# Patient Record
Sex: Male | Born: 1970
Health system: Southern US, Community
[De-identification: ages and names within clinical notes are randomized; demographics above are authoritative.]

## PROBLEM LIST (undated history)

## (undated) DIAGNOSIS — F32A Depression, unspecified: Secondary | ICD-10-CM

## (undated) DIAGNOSIS — R7989 Other specified abnormal findings of blood chemistry: Secondary | ICD-10-CM

## (undated) DIAGNOSIS — G4700 Insomnia, unspecified: Secondary | ICD-10-CM

## (undated) DIAGNOSIS — N529 Male erectile dysfunction, unspecified: Secondary | ICD-10-CM

## (undated) DIAGNOSIS — I10 Essential (primary) hypertension: Secondary | ICD-10-CM

## (undated) DIAGNOSIS — G44009 Cluster headache syndrome, unspecified, not intractable: Secondary | ICD-10-CM

## (undated) DIAGNOSIS — K219 Gastro-esophageal reflux disease without esophagitis: Secondary | ICD-10-CM

## (undated) DIAGNOSIS — F329 Major depressive disorder, single episode, unspecified: Secondary | ICD-10-CM

## (undated) DIAGNOSIS — G4730 Sleep apnea, unspecified: Secondary | ICD-10-CM

## (undated) DIAGNOSIS — J339 Nasal polyp, unspecified: Secondary | ICD-10-CM

## (undated) DIAGNOSIS — K56609 Unspecified intestinal obstruction, unspecified as to partial versus complete obstruction: Secondary | ICD-10-CM

## (undated) HISTORY — DX: Essential (primary) hypertension: I10

## (undated) HISTORY — DX: Unspecified intestinal obstruction, unspecified as to partial versus complete obstruction: K56.609

## (undated) HISTORY — DX: Sleep apnea, unspecified: G47.30

## (undated) HISTORY — DX: Male erectile dysfunction, unspecified: N52.9

## (undated) HISTORY — DX: Other specified abnormal findings of blood chemistry: R79.89

## (undated) HISTORY — DX: Insomnia, unspecified: G47.00

## (undated) HISTORY — PX: HEMORRHOID SURGERY: SHX153

## (undated) HISTORY — DX: Gastro-esophageal reflux disease without esophagitis: K21.9

## (undated) HISTORY — DX: Nasal polyp, unspecified: J33.9

## (undated) HISTORY — DX: Depression, unspecified: F32.A

## (undated) HISTORY — DX: Major depressive disorder, single episode, unspecified: F32.9

## (undated) HISTORY — DX: Cluster headache syndrome, unspecified, not intractable: G44.009

---

## 1997-08-27 ENCOUNTER — Ambulatory Visit (HOSPITAL_COMMUNITY): Admission: RE | Admit: 1997-08-27 | Discharge: 1997-08-27 | Payer: Self-pay | Admitting: Neurology

## 2005-03-05 ENCOUNTER — Ambulatory Visit (HOSPITAL_BASED_OUTPATIENT_CLINIC_OR_DEPARTMENT_OTHER): Admission: RE | Admit: 2005-03-05 | Discharge: 2005-03-05 | Payer: Self-pay | Admitting: Otolaryngology

## 2012-04-13 DIAGNOSIS — Z01810 Encounter for preprocedural cardiovascular examination: Secondary | ICD-10-CM

## 2016-06-19 ENCOUNTER — Encounter: Payer: Self-pay | Admitting: "Endocrinology

## 2016-08-04 LAB — PULMONARY FUNCTION TEST

## 2016-09-20 ENCOUNTER — Encounter: Payer: Self-pay | Admitting: "Endocrinology

## 2016-10-14 ENCOUNTER — Encounter: Payer: Self-pay | Admitting: *Deleted

## 2016-10-15 ENCOUNTER — Telehealth: Payer: Self-pay | Admitting: Cardiovascular Disease

## 2016-10-15 ENCOUNTER — Encounter (INDEPENDENT_AMBULATORY_CARE_PROVIDER_SITE_OTHER): Payer: Self-pay

## 2016-10-15 ENCOUNTER — Other Ambulatory Visit: Payer: Self-pay | Admitting: Cardiovascular Disease

## 2016-10-15 ENCOUNTER — Encounter: Payer: Self-pay | Admitting: *Deleted

## 2016-10-15 ENCOUNTER — Encounter: Payer: Self-pay | Admitting: Cardiovascular Disease

## 2016-10-15 ENCOUNTER — Ambulatory Visit (INDEPENDENT_AMBULATORY_CARE_PROVIDER_SITE_OTHER): Payer: 59 | Admitting: Cardiovascular Disease

## 2016-10-15 VITALS — BP 130/85 | HR 90 | Ht 71.0 in | Wt 263.8 lb

## 2016-10-15 DIAGNOSIS — R0609 Other forms of dyspnea: Secondary | ICD-10-CM | POA: Diagnosis not present

## 2016-10-15 DIAGNOSIS — R079 Chest pain, unspecified: Secondary | ICD-10-CM | POA: Diagnosis not present

## 2016-10-15 DIAGNOSIS — Z716 Tobacco abuse counseling: Secondary | ICD-10-CM

## 2016-10-15 DIAGNOSIS — R5383 Other fatigue: Secondary | ICD-10-CM

## 2016-10-15 DIAGNOSIS — G4733 Obstructive sleep apnea (adult) (pediatric): Secondary | ICD-10-CM | POA: Diagnosis not present

## 2016-10-15 DIAGNOSIS — I10 Essential (primary) hypertension: Secondary | ICD-10-CM | POA: Insufficient documentation

## 2016-10-15 MED ORDER — METOPROLOL TARTRATE 50 MG PO TABS: 50.0000 mg | ORAL_TABLET | ORAL | 0 refills | Status: DC

## 2016-10-15 NOTE — Progress Notes (Signed)
CARDIOLOGY CONSULT NOTE  Patient ID: Devin Ray MRN: [id number] DOB/AGE: 1970/12/18 46 y.o.  Admit date: (Not on file) Primary Physician: Glenda Chroman, MD Referring Physician: Dr. Woody Seller  Reason for Consultation: Exertional dyspnea  HPI: Devin M Dibiasio is a 46 y.o. male who is being seen today for the evaluation of exertional dyspnea at the request of Vyas, Dhruv B, MD.   Echocardiogram performed at an outside facility on 07/26/16 demonstrated normal left ventricular systolic function, LVEF 25-42%, diastolic dysfunction, mild LVH, and no significant valvular abnormalities.  Pulmonary function testing reportedly demonstrated minimal obstructive lung disease.  ECG performed in the office today which I ordered and personally interpreted demonstrated sinus rhythm with a right bundle branch block.  He said that he had an episode of shortness of breath which occurred 4 years ago and he has not felt the same ever since. He was diagnosed with sleep apnea in 2014 and said he uses CPAP religiously. However, it has made a minimal improvement in his symptoms. He is scheduled to see a sleep specialist in [place] in the near future.  He said he has gained a considerable amount of weight and also has low testosterone. He feels mildly depressed.  He works in Scientist, research (life sciences) and receiving. He notices that he breathes more heavily and is also more irritable.  He says no matter how much food he eats, he is unable to satisfy his appetite. He said "things are moving in slow motion at all times ".  He [facility] the [facility] about 2 weeks ago and had some chest pain. He also describes "heart fluttering ".  He smokes 1.5 packs of cigarettes daily and has done so for 30 years.    Not on File  Current Outpatient Prescriptions  Medication Sig Dispense Refill  . buPROPion (WELLBUTRIN XL) 150 MG 24 hr tablet Take 150 mg by mouth daily.    Marland Kitchen testosterone cypionate (DEPOTESTOTERONE CYPIONATE) 100 MG/ML  injection Inject 200 mg into the muscle every 28 (twenty-eight) days. For IM use only     No current facility-administered medications for this visit.     Past Medical History:  Diagnosis Date  . Cluster headache   . Depression   . ED (erectile dysfunction)   . GERD (gastroesophageal reflux disease)   . Hypertension   . Insomnia   . Low testosterone   . Nasal polyp   . Sleep apnea     No past surgical history on file.  Social History   Social History  . Marital status: Married    Spouse name: N/A  . Number of children: N/A  . Years of education: N/A   Occupational History  . Not on file.   Social History Main Topics  . Smoking status: Current Every Day Smoker    Packs/day: 1.50    Types: Cigarettes    Start date: 10/17/1984  . Smokeless tobacco: Former Systems developer    Types: Snuff  . Alcohol use Not on file  . Drug use: Unknown  . Sexual activity: Not on file   Other Topics Concern  . Not on file   Social History Narrative  . No narrative on file     No family history of premature CAD in 1st degree relatives.  Current Meds  Medication Sig  . buPROPion (WELLBUTRIN XL) 150 MG 24 hr tablet Take 150 mg by mouth daily.  Marland Kitchen testosterone cypionate (DEPOTESTOTERONE CYPIONATE) 100 MG/ML injection Inject 200 mg into the  muscle every 28 (twenty-eight) days. For IM use only      Review of systems complete and found to be negative unless listed above in HPI    Physical exam Blood pressure 130/85, pulse 90, height 5\' 11"  (1.803 m), weight 263 lb 12.8 oz (119.7 kg), SpO2 96 %. General: NAD Neck: No JVD, no thyromegaly or thyroid nodule.  Lungs: Clear to auscultation bilaterally with normal respiratory effort. CV: Nondisplaced PMI. Regular rate and rhythm, normal S1/S2, no S3/S4, no murmur.  No peripheral edema.  No carotid bruit.    Abdomen: Soft, nontender, protuberant. Skin: Intact without lesions or rashes.  Neurologic: Alert and oriented x 3.  Psych: Normal  affect. Extremities: No clubbing or cyanosis.  HEENT: Normal.   ECG: Most recent ECG reviewed.   Labs: No results found for: K, BUN, CREATININE, ALT, TSH, HGB   Lipids: No results found for: LDLCALC, LDLDIRECT, CHOL, TRIG, HDL      ASSESSMENT AND PLAN:  1. Exertional dyspnea, chest pain, and fatigue: He has both typical and atypical symptoms for ischemic heart disease. Risk factors include obesity and tobacco abuse. I will obtain a CT with fractional flow reserve to assess for coronary artery disease. Echocardiogram demonstrated normal left ventricular systolic function and regional wall motion.  2. Fatigue: He may need titration of CPAP settings. He is scheduled to see a sleep specialist in [place] in the near future. I recommended tobacco cessation. I will also evaluate for ischemic heart disease. He has low testosterone which could also be contributing to his symptomatology. I will obtain a copy of labs performed by PCP.  3. Obstructive sleep apnea: He may need titration of CPAP settings. He is scheduled to see a sleep specialist in [place] in the near future.  4. Tobacco abuse: I provided cessation counseling (3 minutes).     Disposition: Follow up in 6 weeks.   Signed: Kate Sable, M.D., F.A.C.C.  10/15/2016, 2:23 PM

## 2016-10-15 NOTE — Telephone Encounter (Signed)
CT w/FFR Larence Penning) dx: DOE Scheduled for Oct 29, 2016 arrive  At 14

## 2016-10-15 NOTE — Patient Instructions (Addendum)
Medication Instructions:   Your physician recommends that you continue on your current medications as directed. Please refer to the Current Medication list given to you today.  Labwork:  Your physician recommends that you return for lab work in: BMET.  Testing/Procedures: Your physician has requested that you have cardiac CT. Cardiac computed tomography (CT) is a painless test that uses an x-ray machine to take clear, detailed pictures of your heart. For further information please visit [facility]. Please follow instruction sheet as given.  Follow-Up:  Your physician recommends that you schedule a follow-up appointment in: 6 weeks.  Any Other Special Instructions Will Be Listed Below (If Applicable).   If you need a refill on your cardiac medications before your next appointment, please call your pharmacy.

## 2016-10-15 NOTE — Addendum Note (Signed)
Addended by: Merlene Laughter on: 10/15/2016 03:01 PM   Modules accepted: Orders

## 2016-10-18 NOTE — Telephone Encounter (Signed)
Schedule Nov 08, 2016 at 1:30 Dr Meda Coffee reading  Patient aware of change and knows to be there at 1:00.

## 2016-10-28 ENCOUNTER — Telehealth: Payer: Self-pay | Admitting: *Deleted

## 2016-10-28 NOTE — Telephone Encounter (Signed)
Notes recorded by Laurine Blazer, LPN on 12/27/4973 at 1:59 PM EDT Patient notified. Copy to pmd. ------  Notes recorded by Herminio Commons, MD on 10/27/2016 at 10:59 AM EDT Normal

## 2016-10-29 ENCOUNTER — Ambulatory Visit (HOSPITAL_COMMUNITY): Payer: 59

## 2016-11-08 ENCOUNTER — Ambulatory Visit (HOSPITAL_COMMUNITY): Payer: 59

## 2016-11-08 ENCOUNTER — Encounter (HOSPITAL_COMMUNITY): Payer: Self-pay

## 2016-11-08 ENCOUNTER — Ambulatory Visit (HOSPITAL_COMMUNITY)
Admission: RE | Admit: 2016-11-08 | Discharge: 2016-11-08 | Disposition: A | Discharge status: Home / Self Care | Payer: 59 | Source: Ambulatory Visit | Attending: Cardiovascular Disease | Admitting: Cardiovascular Disease

## 2016-11-08 DIAGNOSIS — I288 Other diseases of pulmonary vessels: Secondary | ICD-10-CM | POA: Diagnosis not present

## 2016-11-08 DIAGNOSIS — I2510 Atherosclerotic heart disease of native coronary artery without angina pectoris: Secondary | ICD-10-CM | POA: Insufficient documentation

## 2016-11-08 DIAGNOSIS — G4733 Obstructive sleep apnea (adult) (pediatric): Secondary | ICD-10-CM | POA: Diagnosis present

## 2016-11-08 DIAGNOSIS — R0609 Other forms of dyspnea: Secondary | ICD-10-CM | POA: Diagnosis not present

## 2016-11-08 MED ORDER — NITROGLYCERIN 0.4 MG SL SUBL
SUBLINGUAL_TABLET | SUBLINGUAL | Status: AC
  Filled 2016-11-08: qty 2

## 2016-11-08 MED ORDER — NITROGLYCERIN 0.4 MG SL SUBL
0.8000 mg | SUBLINGUAL_TABLET | Freq: Once | SUBLINGUAL | Status: AC
  Administered 2016-11-08: 0.8 mg via SUBLINGUAL

## 2016-11-08 MED ORDER — METOPROLOL TARTRATE 5 MG/5ML IV SOLN
INTRAVENOUS | Status: DC
Start: 2016-11-08 — End: 2016-11-09
  Filled 2016-11-08: qty 15

## 2016-11-08 MED ORDER — IOPAMIDOL (ISOVUE-370) INJECTION 76%
INTRAVENOUS | Status: AC
  Administered 2016-11-08: 80 mL
  Filled 2016-11-08: qty 100

## 2016-11-08 MED ORDER — METOPROLOL TARTRATE 5 MG/5ML IV SOLN
5.0000 mg | INTRAVENOUS | Status: DC | PRN
  Administered 2016-11-08 (×3): 5 mg via INTRAVENOUS

## 2016-11-17 ENCOUNTER — Ambulatory Visit: Payer: 59 | Admitting: Pulmonary Disease

## 2016-11-17 ENCOUNTER — Encounter: Payer: Self-pay | Admitting: Pulmonary Disease

## 2016-11-17 DIAGNOSIS — Z9989 Dependence on other enabling machines and devices: Secondary | ICD-10-CM

## 2016-11-17 DIAGNOSIS — G4733 Obstructive sleep apnea (adult) (pediatric): Secondary | ICD-10-CM | POA: Diagnosis not present

## 2016-11-17 DIAGNOSIS — Z720 Tobacco use: Secondary | ICD-10-CM | POA: Diagnosis not present

## 2016-11-17 DIAGNOSIS — R5383 Other fatigue: Secondary | ICD-10-CM | POA: Diagnosis not present

## 2016-11-17 NOTE — Patient Instructions (Signed)
Obtain CPAP download Obtain sleep study  Focus on stopping smoking

## 2016-11-17 NOTE — Assessment & Plan Note (Signed)
Smoking cessation remains the most important intervention here. His lung function is surprisingly normal -does not need bronchodilators

## 2016-11-17 NOTE — Assessment & Plan Note (Signed)
We will try to obtain his baseline sleep studies from [facility]. We will obtain a download on CPAP machine to ensure that his events have been fully corrected.  If that is the case then to treat his persistent daytime somnolence and tiredness we may consider stimulant such as modafinil 150 mg   Weight loss encouraged, compliance with goal of at least 4-6 hrs every night is the expectation. Advised against medications with sedative side effects Cautioned against driving when sleepy - understanding that sleepiness will vary on a day to day basis

## 2016-11-17 NOTE — Progress Notes (Addendum)
Subjective:    Patient ID: Devin Ray, male    DOB: March 26, 1970, 46 y.o.   MRN: [id number]  HPI  46 year old smoker presents for evaluation of obstructive sleep apnea and COPD. Reports excessive daytime tiredness and being tired all the time.  He is very anxious about his diagnosis of low testosterone.  He also reports cluster headaches and blurred vision for which she has an ophthalmology appointment.   He underwent a sleep study in 2014 at [facility] referred by ENT physician, did not tolerate CPAP.  On the second attempt in 2016 he was able to tolerate a full face F 20 mask much better and has been maintained on CPAP since then.  He noticed some improvement in his daytime somnolence and energy levels however he did not feel fully rejuvenated.  He has started on testosterone injections and even this did not help him fully. He reports a loss of libido and general anhedonia-he does not like to go fishing anymore and has a lot of inertia for any kind of activity. Epworth sleepiness score is 16 and he reports sleepiness and tiredness while sitting and reading, watching TV or sitting inactive in a public place. Bedtime is between 10 and 11 PM, sleep latency is 10-20 minutes, he sleeps on his back with 2 pillows, reports 2-3 nocturnal awakenings but denies nocturia -this is improved since he has been on CPAP and is finally out of bed by 5:45 AM feeling tired with dryness of mouth.  This is in spite of turning his humidity up to level 4 on the CPAP machine. He has gained about 30 pounds in the last 2 years  Blood work shows normal TSH and normal renal function. He smokes 1.5 packs/day, more than 30 pack years  He expresses significant anxiety and sadness and difficult to ascertain whether this is arising from his current problems or premorbid  Addendum-04/2012 Home sleep study showed severe OSA with AHI 89/hour  CPAP download from 11/2016 for 1 month was reviewed and showed excellent  control of events and 13 cm with mild leak and good compliance more than 6 hours every night   Significant tests/ events reviewed Cardiac CT-negative except for dilated pulmonary artery 34 mm  PFTs 08/2016 showed no evidence of airway obstruction with the ratio of 81, FEV1 of 93%, normal lung volumes with DLCO 90%  Past Medical History:  Diagnosis Date  . Cluster headache   . Depression   . ED (erectile dysfunction)   . GERD (gastroesophageal reflux disease)   . Hypertension   . Insomnia   . Low testosterone   . Nasal polyp   . Sleep apnea     No past surgical history on file.   No Known Allergies  Social History   Socioeconomic History  . Marital status: Married    Spouse name: Not on file  . Number of children: Not on file  . Years of education: Not on file  . Highest education level: Not on file  Social Needs  . Financial resource strain: Not on file  . Food insecurity - worry: Not on file  . Food insecurity - inability: Not on file  . Transportation needs - medical: Not on file  . Transportation needs - non-medical: Not on file  Occupational History  . Not on file  Tobacco Use  . Smoking status: Current Every Day Smoker    Packs/day: 1.50    Types: Cigarettes    Start date: 10/17/1984  .  Smokeless tobacco: Former Systems developer    Types: Snuff  Substance and Sexual Activity  . Alcohol use: Not on file  . Drug use: Not on file  . Sexual activity: Not on file  Other Topics Concern  . Not on file  Social History Narrative  . Not on file      No family history on file.  Review of Systems Positive for shortness of breath with activity, [place] heartburn, weight gain, anxiety and depression  Constitutional: negative for anorexia, fevers and sweats  Eyes: negative for irritation, redness and visual disturbance  Ears, nose, mouth, throat, and face: negative for earaches, epistaxis, nasal congestion and sore throat  Respiratory: negative for cough, dyspnea on exertion,  sputum and wheezing  Cardiovascular: negative for chest pain, dyspnea, lower extremity edema, orthopnea, palpitations and syncope  Gastrointestinal: negative for abdominal pain, constipation, diarrhea, melena, nausea and vomiting  Genitourinary:negative for dysuria, frequency and hematuria  Hematologic/lymphatic: negative for bleeding, easy bruising and lymphadenopathy  Musculoskeletal:negative for arthralgias, muscle weakness and stiff joints  Neurological: negative for coordination problems, gait problems, headaches and weakness  Endocrine: negative for diabetic symptoms including polydipsia, polyuria and weight loss      Objective:   Physical Exam Gen. Pleasant, obese, in no distress, normal affect ENT - no lesions, no post nasal drip, class 2-3 airway Neck: No JVD, no thyromegaly, no carotid bruits Lungs: no use of accessory muscles, no dullness to percussion, decreased without rales or rhonchi  Cardiovascular: Rhythm regular, heart sounds  normal, no murmurs or gallops, no peripheral edema Abdomen: soft and non-tender, no hepatosplenomegaly, BS normal. Musculoskeletal: No deformities, no cyanosis or clubbing Neuro:  alert, non focal, no tremors        Assessment & Plan:

## 2016-11-17 NOTE — Assessment & Plan Note (Signed)
If his sleep disordered breathing is fully corrected, then we may have to consider other causes of fatigue, TSH level is normal. He is focused on his low testosterone level would defer that to his PCP and may even need an endocrine evaluation. His anhedonia -lack of interest in activities, raises the question of depression but I am not able to tease out whether this is related to his current problem or a cause.  I doubt we are to consider narcolepsy here does not have any symptoms of cataplexy

## 2016-11-18 ENCOUNTER — Telehealth: Payer: Self-pay | Admitting: Pulmonary Disease

## 2016-11-18 NOTE — Telephone Encounter (Signed)
I reviewed his sleep study and CPAP download. He did have severe OSA, stop breathing 89 times an hour.  CPAP 13 cm seems to be very effective and gets this down to less than 3 events an hour.  His usage is good but I see a few miss nights he is he needs to be more consistent.  In other words OSA has been adequately treated, since he is still symptomatic, would suggest trial of modafinil 150 mg every morning at 8 AM and see if this helps improve his energy levels

## 2016-11-26 MED ORDER — MODAFINIL 100 MG PO TABS: ORAL_TABLET | ORAL | 0 refills | Status: DC

## 2016-11-29 ENCOUNTER — Ambulatory Visit: Payer: 59 | Admitting: Cardiovascular Disease

## 2016-11-30 ENCOUNTER — Encounter: Payer: Self-pay | Admitting: Cardiovascular Disease

## 2017-04-19 ENCOUNTER — Encounter: Payer: Self-pay | Admitting: "Endocrinology

## 2017-05-25 ENCOUNTER — Encounter: Payer: Self-pay | Admitting: "Endocrinology

## 2017-05-25 ENCOUNTER — Ambulatory Visit (INDEPENDENT_AMBULATORY_CARE_PROVIDER_SITE_OTHER): Payer: 59 | Admitting: "Endocrinology

## 2017-05-25 VITALS — BP 131/82 | HR 72 | Ht 71.0 in | Wt 263.0 lb

## 2017-05-25 DIAGNOSIS — E291 Testicular hypofunction: Secondary | ICD-10-CM | POA: Insufficient documentation

## 2017-05-25 NOTE — Progress Notes (Signed)
Endocrinology Consult Note   Devin Ray, 47 y.o., male   Chief Complaint  Patient presents with  . Establish Care    Low testosterone     Past Medical History:  Diagnosis Date  . Cluster headache   . Depression   . ED (erectile dysfunction)   . GERD (gastroesophageal reflux disease)   . Hypertension   . Insomnia   . Low testosterone   . Nasal polyp   . Sleep apnea    History reviewed. No pertinent surgical history. Social History   Socioeconomic History  . Marital status: Married    Spouse name: Not on file  . Number of children: Not on file  . Years of education: Not on file  . Highest education level: Not on file  Occupational History  . Not on file  Social Needs  . Financial resource strain: Not on file  . Food insecurity:    Worry: Not on file    Inability: Not on file  . Transportation needs:    Medical: Not on file    Non-medical: Not on file  Tobacco Use  . Smoking status: Current Every Day Smoker    Packs/day: 1.50    Years: 31.00    Pack years: 46.50    Types: Cigarettes    Start date: 10/17/1984  . Smokeless tobacco: Former Systems developer    Types: Snuff  Substance and Sexual Activity  . Alcohol use: Not Currently    Comment: occasional  . Drug use: Never  . Sexual activity: Not on file  Lifestyle  . Physical activity:    Days per week: Not on file    Minutes per session: Not on file  . Stress: Not on file  Relationships  . Social connections:    Talks on phone: Not on file    Gets together: Not on file    Attends religious service: Not on file    Active member of club or organization: Not on file    Attends meetings of clubs or organizations: Not on file    Relationship status: Not on file  Other Topics Concern  . Not on file  Social History Narrative  . Not on file   Outpatient Encounter Medications as of 05/25/2017  Medication Sig  . [DISCONTINUED] buPROPion (WELLBUTRIN XL) 150 MG 24 hr  tablet Take 150 mg by mouth daily.  . [DISCONTINUED] metoprolol tartrate (LOPRESSOR) 50 MG tablet Take 1 tablet (50 mg total) by mouth as directed. Take one hour before cardiac CT  . [DISCONTINUED] modafinil (PROVIGIL) 100 MG tablet Take 150mg  1 1/2 tabs once daily at 8am each morning.  . [DISCONTINUED] testosterone cypionate (DEPOTESTOTERONE CYPIONATE) 100 MG/ML injection Inject 200 mg into the muscle every 14 (fourteen) days. For IM use only    No facility-administered encounter medications on file as of 05/25/2017.    ALLERGIES: No Known Allergies  VACCINATION STATUS:  There is no immunization history on file for this patient.  HPI: Devin Ray is a 47 y.o.-year-old man, being seen in consultation for evaluation and management of low testosterone requested by by his  Glenda Chroman, MD.  -He is currently on testosterone Depo 200 mg every 2 weeks IM, he reports that he achieved symptomatic improvement after he was started on testosterone Depo. -The circumstance of his diagnosis with hypogonadism are not available to review today. -Patient with multiple medical problems including sleep apnea, chronic active smoking with COPD.  He admits for decreased libido. Has difficulty obtaining  or maintaining an erection. He denies  trauma to testes,  chemotherapy,  testicular irradiation,  nor genitourinary surgery. No h/o cryptorchidism. He denies history of  mumps orchitis/ history of  autoimmune disorders.  He grew and went through puberty like his peers. No personal history of  infertility -even though he has no biological children.    No incomplete/delayed sexual development.     No breast discomfort/gynecomastia. No abnormal sense of smell . No hot flushes. No vision problems.   -He reports chronic cluster headaches, also on end of visual disturbance.  No family history of hypogonadism/infertility.    No Family history of hemochromatosis or pituitary tumors. No recent rapid weight change. No  chronic pain, denies exposure to heavy dose opioids, androgens, steroids.   No more than 2 drinks a day of alcohol at a time, and this is rarely. No anabolic steroids use. No herbal medicines. Not on antidepressants.  He does not have family  or personal history of premature  cardiac disease.   ROS: Constitutional: no weight gain/loss, + fatigue, no subjective hyperthermia/hypothermia Eyes: no blurry vision, no xerophthalmia ENT: no sore throat, no nodules palpated in throat, no dysphagia/odynophagia, no hoarseness Cardiovascular: no CP/SOB/palpitations/leg swelling Respiratory: no cough/SOB Gastrointestinal: no N/V/D/C Musculoskeletal: no muscle/joint aches Skin: no rashes Neurological: no tremors/numbness/tingling/dizziness Psychiatric: no depression/anxiety  PE: BP 131/82   Pulse 72   Ht 5\' 11"  (1.803 m)   Wt 263 lb (119.3 kg)   BMI 36.68 kg/m  Wt Readings from Last 3 Encounters:  05/25/17 263 lb (119.3 kg)  11/17/16 265 lb 3.2 oz (120.3 kg)  10/15/16 263 lb 12.8 oz (119.7 kg)   Constitutional: + Obese, not in acute distress.  Eyes: PERRLA, EOMI, no exophthalmos ENT: moist mucous membranes, no thyromegaly, no cervical lymphadenopathy Cardiovascular: RRR, No MRG Respiratory: CTA B Gastrointestinal: abdomen soft, NT, ND, BS+ Musculoskeletal: no deformities, strength intact in all 4 Skin: moist, warm, no rashes Neurological: no tremor with outstretched hands, DTR normal in all 4 Genital exam: normal male escutcheon, no inguinal LAD, normal phallus, testes significantly shrunk bilaterally to 8 -10 cc,  no testicular masses, no penile discharge.  No gynecomastia.  On April 19, 2017 his total testosterone was 544, in September 2018 total testosterone was 192. -He does not have recent CBC, CMP.  Recent sleep study confirms he has sleep apnea.  ASSESSMENT: 1. Hypogonadism 2. Erectile Dysfunction   PLAN:  I have examined the patient, reviewed his labs and have had a long  discussion with the patient regarding his testosterone results.   - I have discussed potential causes of hypogonadism, diagnosis of hypogonadism,  and relevant workup to confirm the diagnosis of hypogonadism before initiating testosterone replacement therapy.   -  I also discussed adverse effects of unnecessary testosterone replacement short-term and long-term.  -It is beneficial to determine etiology of hypogonadism . -For this to happen, he has to come off of testosterone for at least 6 to 7 weeks time.  He agrees to follow recommendations.  -After 6 weeks off of testosterone supplement, he will go to lab for FSH/LH, prolactin, ferritin, total and free testosterone with SHBG. -And he will return for office visit.  -If this workup indicates  Secondary etiology for hypogonadism, he will be considered for MRI imaging of sella/pituitary .  - In this particular patient with bilaterally shrunk testes as well as no biological children (even though he explains that happened by choice), he likely had  chronic hypogonadism. -Given his medical  history of sleep apnea which is relative contraindications for testosterone replacement, if necessary, he will be initiated with the lowest dose of testosterone and titrate based on his response.  -He will benefit the most from weight loss and smoking cessation.  He is extensively counseled to consider smoking cessation.    We briefly discussed about controlling his diet, improving his sleep, and exercise regularly.     2. Erectile dysfunction  - This is chronic and ongoing issue for him.  Hypogonadism likely contributing although  erection is a function of vascular health of the corpora cavernosa.  He will benefit the most from weight loss and smoking cessation for this as well.  -If ED continues to be a factor in his response, he may benefit from a PDE5 inhibitor. -No prescription was initiated today.   - Time spent with the patient: 45 minutes, of which  >50% was spent in obtaining information about his symptoms, reviewing his previous labs, evaluations, and treatments, counseling him about his hypogonadism, ED, and developing a plan to confirm the diagnosis and long term treatment as necessary.  Devin Ray participated in the discussions, expressed understanding, and voiced agreement with the above plans.  All questions were answered to his satisfaction. he is encouraged to contact clinic should he have any questions or concerns prior to his return visit.  Return in about 2 months (around 07/27/2017), or labs in 8 weeks fasting before 8AM, for follow up with pre-visit labs.  Glade Lloyd, MD [facility] [address] [place], [place] [address] Phone: [phone]  Fax: [phone]   05/25/2017, 4:48 PM  This note was partially dictated with voice recognition software. Similar sounding words can be transcribed inadequately or may not  be corrected upon review.

## 2017-07-19 ENCOUNTER — Other Ambulatory Visit: Payer: Self-pay | Admitting: "Endocrinology

## 2017-07-20 LAB — CBC/DIFF AMBIGUOUS DEFAULT
Basophils Absolute: 0 10*3/uL (ref 0.0–0.2)
Basos: 0 %
EOS (ABSOLUTE): 0.1 10*3/uL (ref 0.0–0.4)
Eos: 2 %
Hematocrit: 42.7 % (ref 37.5–51.0)
Hemoglobin: 14.6 g/dL (ref 13.0–17.7)
IMMATURE GRANULOCYTES: 0 %
Immature Grans (Abs): 0 10*3/uL (ref 0.0–0.1)
LYMPHS ABS: 2 10*3/uL (ref 0.7–3.1)
Lymphs: 30 %
MCH: 32.7 pg (ref 26.6–33.0)
MCHC: 34.2 g/dL (ref 31.5–35.7)
MCV: 96 fL (ref 79–97)
MONOS ABS: 0.5 10*3/uL (ref 0.1–0.9)
Monocytes: 8 %
NEUTROS PCT: 60 %
Neutrophils Absolute: 3.9 10*3/uL (ref 1.4–7.0)
Platelets: 216 10*3/uL (ref 150–450)
RBC: 4.46 x10E6/uL (ref 4.14–5.80)
RDW: 13.8 % (ref 12.3–15.4)
WBC: 6.6 10*3/uL (ref 3.4–10.8)

## 2017-07-20 LAB — T4, FREE: FREE T4: 1.34 ng/dL (ref 0.82–1.77)

## 2017-07-20 LAB — FSH/LH
FSH: 4.6 m[IU]/mL (ref 1.5–12.4)
LH: 4.2 m[IU]/mL (ref 1.7–8.6)

## 2017-07-20 LAB — HEPATIC FUNCTION PANEL
ALT: 30 IU/L (ref 0–44)
AST: 20 IU/L (ref 0–40)
Albumin: 4.9 g/dL (ref 3.5–5.5)
Alkaline Phosphatase: 103 IU/L (ref 39–117)
Bilirubin Total: 0.4 mg/dL (ref 0.0–1.2)
Bilirubin, Direct: 0.14 mg/dL (ref 0.00–0.40)
Total Protein: 6.6 g/dL (ref 6.0–8.5)

## 2017-07-20 LAB — HGB A1C W/O EAG: HEMOGLOBIN A1C: 5.7 % — AB (ref 4.8–5.6)

## 2017-07-20 LAB — SPECIMEN STATUS REPORT

## 2017-07-20 LAB — VITAMIN D 25 HYDROXY (VIT D DEFICIENCY, FRACTURES): Vit D, 25-Hydroxy: 32.3 ng/mL (ref 30.0–100.0)

## 2017-07-20 LAB — TESTOSTERONE: Testosterone: 230 ng/dL — ABNORMAL LOW (ref 264–916)

## 2017-07-20 LAB — FERRITIN: Ferritin: 225 ng/mL (ref 30–400)

## 2017-07-20 LAB — PROLACTIN: Prolactin: 5.6 ng/mL (ref 4.0–15.2)

## 2017-07-20 LAB — TSH: TSH: 1.88 u[IU]/mL (ref 0.450–4.500)

## 2017-07-26 ENCOUNTER — Ambulatory Visit (INDEPENDENT_AMBULATORY_CARE_PROVIDER_SITE_OTHER): Payer: 59 | Admitting: "Endocrinology

## 2017-07-26 ENCOUNTER — Encounter: Payer: Self-pay | Admitting: "Endocrinology

## 2017-07-26 VITALS — BP 135/82 | HR 70 | Ht 71.0 in | Wt 247.0 lb

## 2017-07-26 DIAGNOSIS — E291 Testicular hypofunction: Secondary | ICD-10-CM

## 2017-07-26 DIAGNOSIS — Z720 Tobacco use: Secondary | ICD-10-CM

## 2017-07-26 MED ORDER — "SYRINGE 20G X 1"" 3 ML MISC": 0 refills | Status: DC

## 2017-07-26 MED ORDER — TESTOSTERONE CYPIONATE 200 MG/ML IM SOLN: 50.0000 mg | INTRAMUSCULAR | 0 refills | Status: DC

## 2017-07-26 MED ORDER — "SYRINGE 18G X 1-1/2"" 3 ML MISC": 0 refills | Status: DC

## 2017-07-26 NOTE — Progress Notes (Signed)
Endocrinology Consult Note   Devin Ray, 47 y.o., male   Chief Complaint  Patient presents with  . Follow-up    hypogonadism     Past Medical History:  Diagnosis Date  . Cluster headache   . Depression   . ED (erectile dysfunction)   . GERD (gastroesophageal reflux disease)   . Hypertension   . Insomnia   . Low testosterone   . Nasal polyp   . Sleep apnea    History reviewed. No pertinent surgical history. Social History   Socioeconomic History  . Marital status: Married    Spouse name: Not on file  . Number of children: Not on file  . Years of education: Not on file  . Highest education level: Not on file  Occupational History  . Not on file  Social Needs  . Financial resource strain: Not on file  . Food insecurity:    Worry: Not on file    Inability: Not on file  . Transportation needs:    Medical: Not on file    Non-medical: Not on file  Tobacco Use  . Smoking status: Current Every Day Smoker    Packs/day: 1.50    Years: 31.00    Pack years: 46.50    Types: Cigarettes    Start date: 10/17/1984  . Smokeless tobacco: Former Systems developer    Types: Snuff  Substance and Sexual Activity  . Alcohol use: Not Currently    Comment: occasional  . Drug use: Never  . Sexual activity: Not on file  Lifestyle  . Physical activity:    Days per week: Not on file    Minutes per session: Not on file  . Stress: Not on file  Relationships  . Social connections:    Talks on phone: Not on file    Gets together: Not on file    Attends religious service: Not on file    Active member of club or organization: Not on file    Attends meetings of clubs or organizations: Not on file    Relationship status: Not on file  Other Topics Concern  . Not on file  Social History Narrative  . Not on file   Outpatient Encounter Medications as of 07/26/2017  Medication Sig  . Syringe/Needle, Disp, (SYRINGE 3CC/18GX1-1/2") 18G X 1-1/2" 3 ML  MISC Use to draw testosterone  . Syringe/Needle, Disp, (SYRINGE 3CC/20GX1") 20G X 1" 3 ML MISC Use to inject testosterone  . testosterone cypionate (DEPOTESTOSTERONE CYPIONATE) 200 MG/ML injection Inject 0.25 mLs (50 mg total) into the muscle once a week.   No facility-administered encounter medications on file as of 07/26/2017.    ALLERGIES: No Known Allergies  VACCINATION STATUS:  There is no immunization history on file for this patient.  HPI: Devin Ray is a 47 y.o.-year-old man. -He is returning with previsit labs after he was seen in consultation for evaluation and management of low testosterone requested by by his  Glenda Chroman, MD.  -He was taken off of testosterone supplements for baseline evaluation of his gonadal axis.   -He continues to report fatigue.  -Patient with multiple medical problems including sleep apnea, chronic active smoking with COPD.  He admits for decreased libido. Has difficulty obtaining or maintaining an erection. He denies  trauma to testes,  chemotherapy,  testicular irradiation,  nor genitourinary surgery. No history of  cryptorchidism. He denies history of  mumps orchitis/ history of  autoimmune disorders.  He grew and went through puberty like  his peers. No personal history of  infertility -even though he has no biological children.    No history of incomplete/delayed sexual development.     No breast discomfort/gynecomastia. No abnormal sense of smell . No hot flushes. No vision problems.   - No family history of hypogonadism/infertility.    No Family history of hemochromatosis or pituitary tumors. No recent rapid weight change. No chronic pain, denies exposure to heavy dose opioids, androgens, steroids.   No more than 2 drinks a day of alcohol at a time, and this is rarely. No anabolic steroids use. No herbal medicines. Not on antidepressants.  He does not have family  or personal history of premature  cardiac  disease.   ROS: Constitutional: + Lost 15 pounds , + fatigue, no subjective hyperthermia/hypothermia Eyes: no blurry vision, no xerophthalmia ENT: no sore throat, no nodules palpated in throat, no dysphagia/odynophagia, no hoarseness -CVS: No chest pain, no dyspnea. Musculoskeletal: no muscle/joint aches Skin: no rashes Neurological: no tremors/numbness/tingling/dizziness Psychiatric: no depression/anxiety  PE: BP 135/82   Pulse 70   Ht 5\' 11"  (1.803 m)   Wt 247 lb (112 kg)   BMI 34.45 kg/m  Wt Readings from Last 3 Encounters:  07/26/17 247 lb (112 kg)  05/25/17 263 lb (119.3 kg)  11/17/16 265 lb 3.2 oz (120.3 kg)   Constitutional: + Obese, not in acute distress.  Eyes: PERRLA, EOMI, no exophthalmos ENT: moist mucous membranes, no thyromegaly, no cervical lymphadenopathy Musculoskeletal: no deformities, strength intact in all 4 Skin: moist, warm, no rashes Neurological: no tremor with outstretched hands, DTR normal in all 4 Genital exam: normal male escutcheon, no inguinal LAD, normal phallus, testes significantly shrunk bilaterally to 8 -10 cc,  no testicular masses, no penile discharge.  No gynecomastia.  Recent Results (from the past 2160 hour(s))  CBC/Diff Ambiguous Default     Status: None   Collection Time: 07/19/17  2:02 PM  Result Value Ref Range   WBC 6.6 3.4 - 10.8 x10E3/uL   RBC 4.46 4.14 - 5.80 x10E6/uL   Hemoglobin 14.6 13.0 - 17.7 g/dL   Hematocrit 42.7 37.5 - 51.0 %   MCV 96 79 - 97 fL   MCH 32.7 26.6 - 33.0 pg   MCHC 34.2 31.5 - 35.7 g/dL   RDW 13.8 12.3 - 15.4 %   Platelets 216 150 - 450 x10E3/uL   Neutrophils 60 Not Estab. %   Lymphs 30 Not Estab. %   Monocytes 8 Not Estab. %   Eos 2 Not Estab. %   Basos 0 Not Estab. %   Neutrophils Absolute 3.9 1.4 - 7.0 x10E3/uL   Lymphocytes Absolute 2.0 0.7 - 3.1 x10E3/uL   Monocytes Absolute 0.5 0.1 - 0.9 x10E3/uL   EOS (ABSOLUTE) 0.1 0.0 - 0.4 x10E3/uL   Basophils Absolute 0.0 0.0 - 0.2 x10E3/uL    Immature Granulocytes 0 Not Estab. %   Immature Grans (Abs) 0.0 0.0 - 0.1 x10E3/uL    Comment: A hand-written panel/profile was received from your office. In accordance with the [facility] Ambiguous Test Code Policy dated July 6144, we have assigned CBC with Differential/Platelet, Test Code #[id number] to this request. If this is not the testing you wished to receive on this specimen, please contact the [facility] Department to clarify the test order. We appreciate your business.   Hepatic function panel     Status: None   Collection Time: 07/19/17  2:02 PM  Result Value Ref Range   Total  Protein 6.6 6.0 - 8.5 g/dL   Albumin 4.9 3.5 - 5.5 g/dL   Bilirubin Total 0.4 0.0 - 1.2 mg/dL   Bilirubin, Direct 0.14 0.00 - 0.40 mg/dL   Alkaline Phosphatase 103 39 - 117 IU/L   AST 20 0 - 40 IU/L   ALT 30 0 - 44 IU/L  FSH/LH     Status: None   Collection Time: 07/19/17  2:02 PM  Result Value Ref Range   LH 4.2 1.7 - 8.6 mIU/mL   FSH 4.6 1.5 - 12.4 mIU/mL  Hgb A1c w/o eAG     Status: Abnormal   Collection Time: 07/19/17  2:02 PM  Result Value Ref Range   Hgb A1c MFr Bld 5.7 (H) 4.8 - 5.6 %    Comment:          Prediabetes: 5.7 - 6.4          Diabetes: >6.4          Glycemic control for adults with diabetes: <7.0   T4, free     Status: None   Collection Time: 07/19/17  2:02 PM  Result Value Ref Range   Free T4 1.34 0.82 - 1.77 ng/dL  Testosterone     Status: Abnormal   Collection Time: 07/19/17  2:02 PM  Result Value Ref Range   Testosterone 230 (L) 264 - 916 ng/dL    Comment: Adult male reference interval is based on a population of healthy nonobese males (BMI <30) between 8 and 55 years old. [place], [place] [phone]. PMID: [id number].   TSH     Status: None   Collection Time: 07/19/17  2:02 PM  Result Value Ref Range   TSH 1.880 0.450 - 4.500 uIU/mL  Prolactin     Status: None   Collection Time: 07/19/17  2:02 PM  Result Value Ref Range    Prolactin 5.6 4.0 - 15.2 ng/mL  VITAMIN D 25 Hydroxy (Vit-D Deficiency, Fractures)     Status: None   Collection Time: 07/19/17  2:02 PM  Result Value Ref Range   Vit D, 25-Hydroxy 32.3 30.0 - 100.0 ng/mL    Comment: Vitamin D deficiency has been defined by the [place] and an Endocrine Society practice guideline as a level of serum 25-OH vitamin D less than 20 ng/mL (1,2). The Endocrine Society went on to further define vitamin D insufficiency as a level between 21 and 29 ng/mL (2). 1. IOM (Institute of Medicine). 2010. Dietary reference    intakes for calcium and D. [place]: The    [employer]. 2. Holick MF, Binkley [place], Bischoff-Ferrari HA, et al.    Evaluation, treatment, and prevention of vitamin D    deficiency: an Endocrine Society clinical practice    guideline. JCEM. 2011 Jul; 96(7):1911-30.   Ferritin     Status: None   Collection Time: 07/19/17  2:02 PM  Result Value Ref Range   Ferritin 225 30 - 400 ng/mL  Specimen status report     Status: None   Collection Time: 07/19/17  2:02 PM  Result Value Ref Range   specimen status report Comment     Comment: Isac Caddy HFP7 Default Ambig Abbrev HFP7 Default A hand-written panel/profile was received from your office. In accordance with the [facility] Ambiguous Test Code Policy dated July 7209, we have completed your order by using the closest currently or formerly recognized AMA panel.  We have assigned Hepatic Function Panel (7), Test Code [phone] to this request.  If this is not the testing you wished to receive on this specimen, please contact the [facility] Client Inquiry/Technical Services Department to clarify the test order.  We appreciate your business.      ASSESSMENT: 1. Hypogonadism 2. Erectile Dysfunction  3.  Prediabetes 4.  Obesity  PLAN:  -His previsit baseline labs, several months of testosterone supplement, confirm hypogonadism likely primary.   -No suspect for central  hypogonadism, there is no need for pituitary/sella MRI. -He will continue to benefit from testosterone supplement. - I have reviewed his labs and have had a long discussion with the patient regarding his testosterone results.   - I have discussed potential causes of hypogonadism, diagnosis of hypogonadism, and safe use of testosterone supplement. -  I also discussed adverse effects of unnecessary testosterone replacement short-term and long-term.   -Since his baseline testosterone is 230, he does not require 100% replacement to start. -I discussed and initiated testosterone cypionate 50 mg IM every week with plan to repeat total testosterone in 3 months with office visit.  - In this particular patient with bilaterally shrunk testes as well as no biological children (even though he explains that happened by choice), he likely had  chronic hypogonadism. -Given his medical history of sleep apnea which is relative contraindications for testosterone replacement, treatment target is to keep his testosterone total between 400-600 ng/dl.      -He has prediabetes, he will benefit the most from weight loss and smoking cessation.  He is extensively counseled to consider smoking cessation.    2. Erectile dysfunction  - This is chronic and ongoing issue for him.  Hypogonadism likely contributing although  erection is a function of vascular health of the corpora cavernosa.  He will benefit the most from weight loss and smoking cessation for this as well.  -If ED continues to be a factor in his response, he may benefit from a PDE5 inhibitor.    - Time spent with the patient: 25 min, of which >50% was spent in reviewing his  current and  previous labs, previous treatments, and medications doses and developing a plan for long-term care.  Devin Ray participated in the discussions, expressed understanding, and voiced agreement with the above plans.  All questions were answered to his satisfaction. he is  encouraged to contact clinic should he have any questions or concerns prior to his return visit.   Return in about 3 months (around 10/26/2017) for follow up with pre-visit labs.  Glade Lloyd, MD [facility] [address] [place], [place] [address] Phone: [phone]  Fax: [phone]   07/26/2017, 7:05 PM  This note was partially dictated with voice recognition software. Similar sounding words can be transcribed inadequately or may not  be corrected upon review.

## 2017-07-27 ENCOUNTER — Ambulatory Visit: Payer: 59 | Admitting: "Endocrinology

## 2017-10-12 ENCOUNTER — Encounter (INDEPENDENT_AMBULATORY_CARE_PROVIDER_SITE_OTHER): Payer: Self-pay | Admitting: Internal Medicine

## 2017-10-12 ENCOUNTER — Ambulatory Visit (INDEPENDENT_AMBULATORY_CARE_PROVIDER_SITE_OTHER): Payer: 59 | Admitting: Internal Medicine

## 2017-10-12 VITALS — BP 148/82 | HR 72 | Temp 97.8°F | Ht 71.0 in | Wt 245.3 lb

## 2017-10-12 DIAGNOSIS — K56600 Partial intestinal obstruction, unspecified as to cause: Secondary | ICD-10-CM | POA: Insufficient documentation

## 2017-10-12 DIAGNOSIS — K56609 Unspecified intestinal obstruction, unspecified as to partial versus complete obstruction: Secondary | ICD-10-CM | POA: Diagnosis not present

## 2017-10-12 DIAGNOSIS — K219 Gastro-esophageal reflux disease without esophagitis: Secondary | ICD-10-CM

## 2017-10-12 HISTORY — DX: Unspecified intestinal obstruction, unspecified as to partial versus complete obstruction: K56.609

## 2017-10-12 MED ORDER — PANTOPRAZOLE SODIUM 40 MG PO TBEC: 40.0000 mg | DELAYED_RELEASE_TABLET | Freq: Every day | ORAL | 3 refills | Status: DC

## 2017-10-12 NOTE — Progress Notes (Addendum)
   Subjective:    Patient ID: Devin Ray, male    DOB: [birth year], 47 y.o.   MRN: [id number]  HPI Referred by Dr. Woody Seller for abdominal pain/bowel obstruction. Admitted to [facility] in September with a possible small bowel obstruction. He was evaluated by surgery (Dr. Anthony Sar).  No prior hx of similar complaints. No hx of prior abdominal surgery. NG was placed during admission. His abdomen did become distended. He did have some BMs while in the hospital. He had eaten tacos prior to the abdominal pain.   Dr. Anthony Sar felt this was more likely gastroenteritis or foods poisoning. He was admitted x 3 days. He tells me he has been doing good. He says he had the same pain about a month prior to this. The pain lasted for about a minute or two, but passed  quickly. His BMs are moving normally. When he does go, he says his BMs are incomplete and he goes 3-4 times a day.  Appetite is okay. Has been on a diet and has lost about 25 pounds intentional. He is 100% better.  He says he has acid reflux which comes up. Has been taking Zantac as needed.  09/08/2017 CT abdomen/pelvis with CM: flank pain, nausea and vomiting:  Moderately dilated small bowel loops without transition point. Findings are suspicious for small bowel enteritis or ileus. Colonic diverticulosis. Scattered hypodensities within the liver statistically consistent with cyst or hemangiomata.    Hx of sleep apnea. No family hx of colon. No family hx of IBD  09/14/2017 H and H 17.1 and 49.6, Lipase 20, Albumkn 4.8, Total bili 0.9, AST 18.4, ALT 20     Review of Systems Past Medical History:  Diagnosis Date  . Cluster headache   . Depression   . ED (erectile dysfunction)   . GERD (gastroesophageal reflux disease)   . Hypertension   . Insomnia   . Low testosterone   . Nasal polyp   . SBO (small bowel obstruction) ([place]) 10/12/2017  . Sleep apnea     History reviewed. No pertinent surgical history.  No Known Allergies  Current Outpatient  Medications on File Prior to Visit  Medication Sig Dispense Refill  . testosterone cypionate (DEPOTESTOSTERONE CYPIONATE) 200 MG/ML injection Inject 0.25 mLs (50 mg total) into the muscle once a week. 10 mL 0  . Syringe/Needle, Disp, (SYRINGE 3CC/18GX1-1/2") 18G X 1-1/2" 3 ML MISC Use to draw testosterone (Patient not taking: Reported on 10/12/2017) 100 each 0  . Syringe/Needle, Disp, (SYRINGE 3CC/20GX1") 20G X 1" 3 ML MISC Use to inject testosterone (Patient not taking: Reported on 10/12/2017) 100 each 0   No current facility-administered medications on file prior to visit.         Objective:   Physical Exam Blood pressure (!) 148/82, pulse 72, temperature 97.8 F (36.6 C), height 5\' 11"  (1.803 m), weight 245 lb 4.8 oz (111.3 kg).  Alert and oriented. Skin warm and dry. Oral mucosa is moist.   . Sclera anicteric, conjunctivae is pink. Thyroid not enlarged. No cervical lymphadenopathy. Lungs clear. Heart regular rate and rhythm.  Abdomen is soft. Bowel sounds are positive. No hepatomegaly. No abdominal masses felt. No tenderness.  No edema to lower extremities.          Assessment & Plan:  SBO resolved. Will discuss with Dr. Laural Golden. Patient is requesting a colonoscopy.  'GERD: Rx for Protonix 40mg  daily.

## 2017-10-18 ENCOUNTER — Other Ambulatory Visit (INDEPENDENT_AMBULATORY_CARE_PROVIDER_SITE_OTHER): Payer: Self-pay | Admitting: Internal Medicine

## 2017-10-18 DIAGNOSIS — Z8719 Personal history of other diseases of the digestive system: Secondary | ICD-10-CM

## 2017-10-18 NOTE — Progress Notes (Signed)
err

## 2017-10-20 ENCOUNTER — Telehealth (INDEPENDENT_AMBULATORY_CARE_PROVIDER_SITE_OTHER): Payer: Self-pay | Admitting: Internal Medicine

## 2017-10-20 DIAGNOSIS — Z8719 Personal history of other diseases of the digestive system: Secondary | ICD-10-CM | POA: Insufficient documentation

## 2017-10-20 NOTE — Telephone Encounter (Signed)
Devin Ray, Colonoscopy. Hx of SBO

## 2017-10-20 NOTE — Telephone Encounter (Signed)
TCS sch'd 01/11/18 at 210, patient aware

## 2017-10-26 ENCOUNTER — Ambulatory Visit: Payer: 59 | Admitting: "Endocrinology

## 2017-10-26 ENCOUNTER — Other Ambulatory Visit: Payer: Self-pay | Admitting: "Endocrinology

## 2017-10-27 LAB — PSA: Prostate Specific Ag, Serum: 1.3 ng/mL (ref 0.0–4.0)

## 2017-11-10 ENCOUNTER — Ambulatory Visit: Payer: 59 | Admitting: "Endocrinology

## 2017-11-10 ENCOUNTER — Other Ambulatory Visit: Payer: Self-pay | Admitting: "Endocrinology

## 2017-11-10 DIAGNOSIS — E291 Testicular hypofunction: Secondary | ICD-10-CM

## 2017-11-11 LAB — TESTOSTERONE: TESTOSTERONE: 184 ng/dL — AB (ref 264–916)

## 2017-11-21 ENCOUNTER — Ambulatory Visit (INDEPENDENT_AMBULATORY_CARE_PROVIDER_SITE_OTHER): Payer: 59 | Admitting: Otolaryngology

## 2017-11-21 DIAGNOSIS — J343 Hypertrophy of nasal turbinates: Secondary | ICD-10-CM | POA: Diagnosis not present

## 2017-11-21 DIAGNOSIS — J310 Chronic rhinitis: Secondary | ICD-10-CM | POA: Diagnosis not present

## 2017-11-25 ENCOUNTER — Other Ambulatory Visit (INDEPENDENT_AMBULATORY_CARE_PROVIDER_SITE_OTHER): Payer: Self-pay | Admitting: Otolaryngology

## 2017-11-25 DIAGNOSIS — J320 Chronic maxillary sinusitis: Secondary | ICD-10-CM

## 2017-11-29 ENCOUNTER — Ambulatory Visit (INDEPENDENT_AMBULATORY_CARE_PROVIDER_SITE_OTHER): Payer: 59 | Admitting: "Endocrinology

## 2017-11-29 ENCOUNTER — Encounter: Payer: Self-pay | Admitting: "Endocrinology

## 2017-11-29 VITALS — BP 121/83 | HR 81 | Ht 71.0 in | Wt 252.0 lb

## 2017-11-29 DIAGNOSIS — E291 Testicular hypofunction: Secondary | ICD-10-CM | POA: Diagnosis not present

## 2017-11-29 NOTE — Progress Notes (Signed)
Endocrinology Consult Note   Devin Ray, 47 y.o., male   Chief Complaint  Patient presents with  . Follow-up    Hypogonadism     Past Medical History:  Diagnosis Date  . Cluster headache   . Depression   . ED (erectile dysfunction)   . GERD (gastroesophageal reflux disease)   . Hypertension   . Insomnia   . Low testosterone   . Nasal polyp   . SBO (small bowel obstruction) ([place]) 10/12/2017  . Sleep apnea    Past Surgical History:  Procedure Laterality Date  . HEMORRHOID SURGERY     Social History   Socioeconomic History  . Marital status: Married    Spouse name: Not on file  . Number of children: Not on file  . Years of education: Not on file  . Highest education level: Not on file  Occupational History  . Not on file  Social Needs  . Financial resource strain: Not on file  . Food insecurity:    Worry: Not on file    Inability: Not on file  . Transportation needs:    Medical: Not on file    Non-medical: Not on file  Tobacco Use  . Smoking status: Current Every Day Smoker    Packs/day: 1.50    Years: 31.00    Pack years: 46.50    Types: Cigarettes    Start date: 10/17/1984  . Smokeless tobacco: Former Systems developer    Types: Snuff  Substance and Sexual Activity  . Alcohol use: Not Currently    Comment: occasional  . Drug use: Never  . Sexual activity: Not on file  Lifestyle  . Physical activity:    Days per week: Not on file    Minutes per session: Not on file  . Stress: Not on file  Relationships  . Social connections:    Talks on phone: Not on file    Gets together: Not on file    Attends religious service: Not on file    Active member of club or organization: Not on file    Attends meetings of clubs or organizations: Not on file    Relationship status: Not on file  Other Topics Concern  . Not on file  Social History Narrative  . Not on file   Outpatient Encounter Medications as of 11/29/2017   Medication Sig  . pantoprazole (PROTONIX) 40 MG tablet Take 1 tablet (40 mg total) by mouth daily.  . Syringe/Needle, Disp, (SYRINGE 3CC/18GX1-1/2") 18G X 1-1/2" 3 ML MISC Use to draw testosterone (Patient not taking: Reported on 10/12/2017)  . Syringe/Needle, Disp, (SYRINGE 3CC/20GX1") 20G X 1" 3 ML MISC Use to inject testosterone (Patient not taking: Reported on 10/12/2017)  . testosterone cypionate (DEPOTESTOSTERONE CYPIONATE) 200 MG/ML injection Inject 0.25 mLs (50 mg total) into the muscle once a week.   No facility-administered encounter medications on file as of 11/29/2017.    ALLERGIES: No Known Allergies  VACCINATION STATUS:  There is no immunization history on file for this patient.  HPI: Devin Ray is a 47 y.o.-year-old man, returning for follow-up of hypogonadism.  He was diagnosed with hypogonadotropic hypogonadism previously, last seen in July 2019 when he was given a prescription for testosterone 50 mg IM every 7 days.  However, patient reports that he has not taken testosterone since last August for unclear reasons. -He is returning with previsit labs showing testosterone still low at 184.  -He continues to report fatigue.  -Patient with multiple medical problems  including sleep apnea, chronic active smoking with COPD.  He admits for decreased libido. Has difficulty obtaining or maintaining an erection. He denies  trauma to testes,  chemotherapy,  testicular irradiation,  nor genitourinary surgery. No history of  cryptorchidism. He denies history of  mumps orchitis/ history of  autoimmune disorders.  He grew and went through puberty like his peers. No personal history of  infertility -even though he has no biological children.    No history of incomplete/delayed sexual development.     No breast discomfort/gynecomastia. No abnormal sense of smell . No hot flushes. No vision problems.  He has seasonal cluster headaches, currently denies any headaches. - No family history  of hypogonadism/infertility.    No Family history of hemochromatosis or pituitary tumors. No recent rapid weight change. No chronic pain, denies exposure to heavy dose opioids, androgens, steroids.   No more than 2 drinks a day of alcohol at a time, and this is rarely. No anabolic steroids use. No herbal medicines. Not on antidepressants.  He is a chronic heavy smoker.  ROS: Constitutional: + Gained 5 pounds since last visit, + fatigue, no subjective hyperthermia/hypothermia Eyes: no blurry vision, no xerophthalmia ENT: no sore throat, no nodules palpated in throat, no dysphagia/odynophagia, no hoarseness -CVS: No chest pain, no dyspnea. Musculoskeletal: + Diffuse joint aches.   Skin: no rashes Neurological: no tremors/numbness/tingling/dizziness Psychiatric: no depression/anxiety  PE: BP 121/83   Pulse 81   Ht 5\' 11"  (1.803 m)   Wt 252 lb (114.3 kg)   BMI 35.15 kg/m  Wt Readings from Last 3 Encounters:  11/29/17 252 lb (114.3 kg)  10/12/17 245 lb 4.8 oz (111.3 kg)  07/26/17 247 lb (112 kg)   Constitutional: + Obese, not in acute distress.  Eyes: PERRLA, EOMI, no exophthalmos ENT: moist mucous membranes, no thyromegaly, no cervical lymphadenopathy Musculoskeletal: no deformities, strength intact in all 4 Skin: moist, warm, no rashes Neurological: no tremor with outstretched hands, DTR normal in all 4 Genital exam: normal male escutcheon, no inguinal LAD, normal phallus, testes significantly shrunk bilaterally to 8 -10 cc,  no testicular masses, no penile discharge.  No gynecomastia.  Recent Results (from the past 2160 hour(s))  PSA     Status: None   Collection Time: 10/26/17  3:11 PM  Result Value Ref Range   Prostate Specific Ag, Serum 1.3 0.0 - 4.0 ng/mL    Comment: Roche ECLIA methodology. According to the American Urological Association, Serum PSA should decrease and remain at undetectable levels after radical prostatectomy. The AUA defines biochemical recurrence as  an initial PSA value 0.2 ng/mL or greater followed by a subsequent confirmatory PSA value 0.2 ng/mL or greater. Values obtained with different assay methods or kits cannot be used interchangeably. Results cannot be interpreted as absolute evidence of the presence or absence of malignant disease.   Testosterone     Status: Abnormal   Collection Time: 11/10/17  3:31 PM  Result Value Ref Range   Testosterone 184 (L) 264 - 916 ng/dL    Comment: Adult male reference interval is based on a population of healthy nonobese males (BMI <30) between 18 and 78 years old. [place], [place] [phone]. PMID: [id number].      ASSESSMENT: 1. Hypogonadism 2. Erectile Dysfunction  3.  Prediabetes 4.  Obesity  PLAN:   -His previous work-up showed evidence of secondary hypogonadism with inappropriately normal troponins for total testosterone of 230.   -His prolactin was 5.6, no suspicion for central auditory/sella lesion.    -  He did not use testosterone for several month, and wishes to be worked up again. -I ordered FSH/LH, prolactin, ferritin, PSA on a morning sample and he will return in 15 days for reevaluation.  -This patient was previously initiated on testosterone replacement at 50 mg IM every week.  -  I discussed adverse effects of unnecessary testosterone replacement short-term and long-term.   - In this particular patient with bilaterally shrunk testes as well as no biological children (even though he explains that happened by choice), he likely had  chronic hypogonadism. -Given his medical history of sleep apnea which is relative contraindications for testosterone replacement, treatment target is to keep his testosterone total between 400-600 ng/dl.      -He has prediabetes, he will benefit the most from weight loss and smoking cessation.  He is extensively counseled to consider smoking cessation.    2. Erectile dysfunction  - This is chronic and ongoing issue for him.   Hypogonadism likely contributing although  erection is a function of vascular health of the corpora cavernosa.  He will benefit the most from weight loss and smoking cessation for this as well.  -If ED continues to be a factor in his response, he may benefit from a PDE5 inhibitor.   Return in about 10 days (around 12/09/2017), or Fasting at 8AM, for Follow up with Pre-visit Labs.  Glade Lloyd, MD [facility] [address] [place], [place] [address] Phone: [phone]  Fax: [phone]   11/29/2017, 5:40 PM  This note was partially dictated with voice recognition software. Similar sounding words can be transcribed inadequately or may not  be corrected upon review.

## 2017-12-06 ENCOUNTER — Encounter (INDEPENDENT_AMBULATORY_CARE_PROVIDER_SITE_OTHER): Payer: Self-pay | Admitting: *Deleted

## 2017-12-06 ENCOUNTER — Telehealth (INDEPENDENT_AMBULATORY_CARE_PROVIDER_SITE_OTHER): Payer: Self-pay | Admitting: *Deleted

## 2017-12-06 MED ORDER — SUPREP BOWEL PREP KIT 17.5-3.13-1.6 GM/177ML PO SOLN: 1.0000 | Freq: Once | ORAL | 0 refills | Status: AC

## 2017-12-06 NOTE — Telephone Encounter (Signed)
Patient needs suprep 

## 2017-12-10 ENCOUNTER — Other Ambulatory Visit: Payer: Self-pay | Admitting: "Endocrinology

## 2017-12-12 LAB — FOLLICLE STIMULATING HORMONE: FSH: 6.2 m[IU]/mL (ref 1.5–12.4)

## 2017-12-12 LAB — PROLACTIN: Prolactin: 6.7 ng/mL (ref 4.0–15.2)

## 2017-12-12 LAB — CORTISOL-AM, BLOOD: Cortisol - AM: 8 ug/dL (ref 6.2–19.4)

## 2017-12-12 LAB — FERRITIN: Ferritin: 455 ng/mL — ABNORMAL HIGH (ref 30–400)

## 2017-12-12 LAB — LUTEINIZING HORMONE: LH: 7.2 m[IU]/mL (ref 1.7–8.6)

## 2017-12-14 ENCOUNTER — Ambulatory Visit (HOSPITAL_COMMUNITY)
Admission: RE | Admit: 2017-12-14 | Discharge: 2017-12-14 | Disposition: A | Discharge status: Home / Self Care | Payer: 59 | Source: Ambulatory Visit | Attending: Otolaryngology | Admitting: Otolaryngology

## 2017-12-14 DIAGNOSIS — J320 Chronic maxillary sinusitis: Secondary | ICD-10-CM | POA: Diagnosis not present

## 2017-12-21 ENCOUNTER — Ambulatory Visit (INDEPENDENT_AMBULATORY_CARE_PROVIDER_SITE_OTHER): Payer: 59 | Admitting: "Endocrinology

## 2017-12-21 ENCOUNTER — Encounter: Payer: Self-pay | Admitting: "Endocrinology

## 2017-12-21 VITALS — BP 134/82 | HR 90 | Ht 71.0 in | Wt 265.0 lb

## 2017-12-21 DIAGNOSIS — E291 Testicular hypofunction: Secondary | ICD-10-CM | POA: Diagnosis not present

## 2017-12-21 MED ORDER — TESTOSTERONE CYPIONATE 100 MG/ML IM SOLN: 50.0000 mg | INTRAMUSCULAR | 0 refills | Status: DC

## 2017-12-21 NOTE — Progress Notes (Signed)
Endocrinology follow-up  Note   Devin M Aja, 47 y.o., male   Chief Complaint  Patient presents with  . Follow-up    Hypogonadism     Past Medical History:  Diagnosis Date  . Cluster headache   . Depression   . ED (erectile dysfunction)   . GERD (gastroesophageal reflux disease)   . Hypertension   . Insomnia   . Low testosterone   . Nasal polyp   . SBO (small bowel obstruction) ([place]) 10/12/2017  . Sleep apnea    Past Surgical History:  Procedure Laterality Date  . HEMORRHOID SURGERY     Social History   Socioeconomic History  . Marital status: Married    Spouse name: Not on file  . Number of children: Not on file  . Years of education: Not on file  . Highest education level: Not on file  Occupational History  . Not on file  Social Needs  . Financial resource strain: Not on file  . Food insecurity:    Worry: Not on file    Inability: Not on file  . Transportation needs:    Medical: Not on file    Non-medical: Not on file  Tobacco Use  . Smoking status: Current Every Day Smoker    Packs/day: 1.50    Years: 31.00    Pack years: 46.50    Types: Cigarettes    Start date: 10/17/1984  . Smokeless tobacco: Former Systems developer    Types: Snuff  Substance and Sexual Activity  . Alcohol use: Not Currently    Comment: occasional  . Drug use: Never  . Sexual activity: Not on file  Lifestyle  . Physical activity:    Days per week: Not on file    Minutes per session: Not on file  . Stress: Not on file  Relationships  . Social connections:    Talks on phone: Not on file    Gets together: Not on file    Attends religious service: Not on file    Active member of club or organization: Not on file    Attends meetings of clubs or organizations: Not on file    Relationship status: Not on file  Other Topics Concern  . Not on file  Social History Narrative  . Not on file   Outpatient Encounter Medications as of 12/21/2017   Medication Sig  . pantoprazole (PROTONIX) 40 MG tablet Take 1 tablet (40 mg total) by mouth daily.  . Syringe/Needle, Disp, (SYRINGE 3CC/18GX1-1/2") 18G X 1-1/2" 3 ML MISC Use to draw testosterone (Patient not taking: Reported on 10/12/2017)  . Syringe/Needle, Disp, (SYRINGE 3CC/20GX1") 20G X 1" 3 ML MISC Use to inject testosterone (Patient not taking: Reported on 10/12/2017)  . testosterone cypionate (DEPOTESTOTERONE CYPIONATE) 100 MG/ML injection Inject 0.5 mLs (50 mg total) into the muscle every 7 (seven) days. For IM use only  . [DISCONTINUED] testosterone cypionate (DEPOTESTOSTERONE CYPIONATE) 200 MG/ML injection Inject 0.25 mLs (50 mg total) into the muscle once a week.   No facility-administered encounter medications on file as of 12/21/2017.    ALLERGIES: No Known Allergies  VACCINATION STATUS:  There is no immunization history on file for this patient.  HPI: Devin Ray is a 47 y.o.-year-old man, returning for follow-up of hypogonadism.  He was diagnosed with hypogonadotropic hypogonadism previously.  -He is returning with previsit labs showing testosterone still low at 184.  -He continues to report fatigue.  -Patient with multiple medical problems including sleep apnea, chronic active smoking  with COPD.  He admits for decreased libido. Has difficulty obtaining or maintaining an erection. He denies  trauma to testes,  chemotherapy,  testicular irradiation,  nor genitourinary surgery. No history of  cryptorchidism. He denies history of  mumps orchitis/ history of  autoimmune disorders.  He grew and went through puberty like his peers. No personal history of  infertility -even though he has no biological children.    No history of incomplete/delayed sexual development.     No breast discomfort/gynecomastia. No abnormal sense of smell . No hot flushes. No vision problems.  He has seasonal cluster headaches, currently denies any headaches. - No family history of  hypogonadism/infertility.    No Family history of hemochromatosis or pituitary tumors. No recent rapid weight change. No chronic pain, denies exposure to heavy dose opioids, androgens, steroids.   No more than 2 drinks a day of alcohol at a time, and this is rarely. No anabolic steroids use. No herbal medicines. Not on antidepressants.  He is a chronic heavy smoker.  ROS: Constitutional: + Gained 13 pounds since last visit, + fatigue, no subjective hyperthermia/hypothermia Eyes: no blurry vision, no xerophthalmia ENT: no sore throat, no nodules palpated in throat, no dysphagia/odynophagia, no hoarseness -CVS: No chest pain, no dyspnea. Musculoskeletal: + Diffuse joint aches.   Skin: no rashes Neurological: no tremors/numbness/tingling/dizziness Psychiatric: no depression/anxiety  PE: BP 134/82   Pulse 90   Wt 265 lb (120.2 kg)   BMI 36.96 kg/m  Wt Readings from Last 3 Encounters:  12/21/17 265 lb (120.2 kg)  11/29/17 252 lb (114.3 kg)  10/12/17 245 lb 4.8 oz (111.3 kg)   Constitutional: + Obese, not in acute distress.  Eyes: PERRLA, EOMI, no exophthalmos ENT: moist mucous membranes, no thyromegaly, no cervical lymphadenopathy Musculoskeletal: no deformities, strength intact in all 4 Skin: moist, warm, no rashes Neurological: no tremor with outstretched hands, DTR normal in all 4 Genital exam: normal male escutcheon, no inguinal LAD, normal phallus, testes significantly shrunk bilaterally to 8 -10 cc,  no testicular masses, no penile discharge.  No gynecomastia.  Recent Results (from the past 2160 hour(s))  PSA     Status: None   Collection Time: 10/26/17  3:11 PM  Result Value Ref Range   Prostate Specific Ag, Serum 1.3 0.0 - 4.0 ng/mL    Comment: Roche ECLIA methodology. According to the American Urological Association, Serum PSA should decrease and remain at undetectable levels after radical prostatectomy. The AUA defines biochemical recurrence as an initial PSA value  0.2 ng/mL or greater followed by a subsequent confirmatory PSA value 0.2 ng/mL or greater. Values obtained with different assay methods or kits cannot be used interchangeably. Results cannot be interpreted as absolute evidence of the presence or absence of malignant disease.   Testosterone     Status: Abnormal   Collection Time: 11/10/17  3:31 PM  Result Value Ref Range   Testosterone 184 (L) 264 - 916 ng/dL    Comment: Adult male reference interval is based on a population of healthy nonobese males (BMI <30) between 61 and 61 years old. [place], [place] [phone]. PMID: [id number].   Luteinizing hormone     Status: None   Collection Time: 12/10/17  8:50 AM  Result Value Ref Range   LH 7.2 1.7 - 8.6 mIU/mL  Follicle stimulating hormone     Status: None   Collection Time: 12/10/17  8:50 AM  Result Value Ref Range   FSH 6.2 1.5 - 12.4 mIU/mL  Prolactin  Status: None   Collection Time: 12/10/17  8:50 AM  Result Value Ref Range   Prolactin 6.7 4.0 - 15.2 ng/mL  Ferritin     Status: Abnormal   Collection Time: 12/10/17  8:50 AM  Result Value Ref Range   Ferritin 455 (H) 30 - 400 ng/mL  Cortisol-am, blood     Status: None   Collection Time: 12/10/17  8:50 AM  Result Value Ref Range   Cortisol - AM 8.0 6.2 - 19.4 ug/dL     ASSESSMENT: 1. Hypogonadism 2. Erectile Dysfunction  3.  Prediabetes 4.  Obesity  PLAN:   -His previous work-up showed evidence of secondary hypogonadism with inappropriately normal troponins for total testosterone of 230.  His most recent labs show hypogonadism with testosterone 184.  His gonadotropins are inappropriately normal, prolactin is 6.7 normal. - no suspicion for central auditory/sella lesion.   -He wishes to be treated with testosterone supplement.  I discussed and reinitiated testosterone 50 mg IM every 7 days with plan to repeat labs in 3 months.   -  I discussed adverse effects of unnecessary testosterone replacement  short-term and long-term.   - In this particular patient with bilaterally shrunk testes as well as no biological children (even though he explains that happened by choice), he likely had  chronic hypogonadism. -Given his medical history of sleep apnea which is relative contraindications for testosterone replacement, treatment target is to keep his testosterone total between 400-600 ng/dl.      -He has prediabetes, he will benefit the most from weight loss and smoking cessation.  He is extensively counseled to consider smoking cessation.    2. Erectile dysfunction  - This is chronic and ongoing issue for him.  Hypogonadism likely contributing although  erection is a function of vascular health of the corpora cavernosa.  He will benefit the most from weight loss and smoking cessation for this as well.  -If ED continues to be a factor in his response, he may benefit from a PDE5 inhibitor.   Return in about 3 months (around 03/22/2018) for Follow up with Pre-visit Labs.  Glade Lloyd, MD [facility] [address] [place], [place] [address] Phone: [phone]  Fax: [phone]   12/21/2017, 5:21 PM  This note was partially dictated with voice recognition software. Similar sounding words can be transcribed inadequately or may not  be corrected upon review.

## 2017-12-22 ENCOUNTER — Ambulatory Visit (INDEPENDENT_AMBULATORY_CARE_PROVIDER_SITE_OTHER): Payer: 59 | Admitting: Otolaryngology

## 2018-01-09 ENCOUNTER — Telehealth (INDEPENDENT_AMBULATORY_CARE_PROVIDER_SITE_OTHER): Payer: Self-pay | Admitting: *Deleted

## 2018-01-09 ENCOUNTER — Telehealth (INDEPENDENT_AMBULATORY_CARE_PROVIDER_SITE_OTHER): Payer: Self-pay | Admitting: Internal Medicine

## 2018-01-09 NOTE — Telephone Encounter (Signed)
appt has been canceled

## 2018-01-09 NOTE — Telephone Encounter (Signed)
Patient wants to cancel colonoscopy for 01-11-2018 - can't pay the $3400 after procedure

## 2018-01-09 NOTE — Telephone Encounter (Signed)
Patient called in -- wants to cancel TCS for 01/11/18 due to financial issues

## 2018-01-10 NOTE — Telephone Encounter (Signed)
noted 

## 2018-01-11 ENCOUNTER — Encounter (HOSPITAL_COMMUNITY): Admission: RE | Payer: Self-pay | Source: Home / Self Care

## 2018-01-11 ENCOUNTER — Ambulatory Visit (HOSPITAL_COMMUNITY): Admission: RE | Admit: 2018-01-11 | Payer: 59 | Source: Home / Self Care | Admitting: Internal Medicine

## 2018-01-11 SURGERY — COLONOSCOPY
Anesthesia: Moderate Sedation

## 2018-03-22 ENCOUNTER — Ambulatory Visit: Payer: 59 | Admitting: "Endocrinology

## 2018-04-05 ENCOUNTER — Ambulatory Visit: Payer: 59 | Admitting: "Endocrinology

## 2019-01-29 DIAGNOSIS — R5381 Other malaise: Secondary | ICD-10-CM | POA: Insufficient documentation

## 2019-01-29 DIAGNOSIS — F17210 Nicotine dependence, cigarettes, uncomplicated: Secondary | ICD-10-CM | POA: Insufficient documentation

## 2019-01-29 DIAGNOSIS — R0602 Shortness of breath: Secondary | ICD-10-CM | POA: Insufficient documentation

## 2019-01-29 DIAGNOSIS — R9431 Abnormal electrocardiogram [ECG] [EKG]: Secondary | ICD-10-CM | POA: Insufficient documentation

## 2019-01-29 DIAGNOSIS — E669 Obesity, unspecified: Secondary | ICD-10-CM | POA: Insufficient documentation

## 2019-08-23 ENCOUNTER — Ambulatory Visit: Payer: 59 | Admitting: Endocrinology

## 2019-09-17 ENCOUNTER — Encounter: Payer: Self-pay | Admitting: Endocrinology

## 2019-09-17 ENCOUNTER — Ambulatory Visit (INDEPENDENT_AMBULATORY_CARE_PROVIDER_SITE_OTHER): Payer: 59 | Admitting: Endocrinology

## 2019-09-17 ENCOUNTER — Other Ambulatory Visit: Payer: Self-pay

## 2019-09-17 ENCOUNTER — Other Ambulatory Visit (INDEPENDENT_AMBULATORY_CARE_PROVIDER_SITE_OTHER): Payer: 59

## 2019-09-17 VITALS — BP 130/80 | HR 86 | Ht 71.0 in | Wt 256.8 lb

## 2019-09-17 DIAGNOSIS — R5382 Chronic fatigue, unspecified: Secondary | ICD-10-CM

## 2019-09-17 DIAGNOSIS — F419 Anxiety disorder, unspecified: Secondary | ICD-10-CM | POA: Insufficient documentation

## 2019-09-17 DIAGNOSIS — L0293 Carbuncle, unspecified: Secondary | ICD-10-CM | POA: Insufficient documentation

## 2019-09-17 DIAGNOSIS — R519 Headache, unspecified: Secondary | ICD-10-CM | POA: Insufficient documentation

## 2019-09-17 DIAGNOSIS — J069 Acute upper respiratory infection, unspecified: Secondary | ICD-10-CM | POA: Insufficient documentation

## 2019-09-17 DIAGNOSIS — R3589 Other polyuria: Secondary | ICD-10-CM | POA: Insufficient documentation

## 2019-09-17 DIAGNOSIS — F32A Depression, unspecified: Secondary | ICD-10-CM | POA: Insufficient documentation

## 2019-09-17 DIAGNOSIS — G4733 Obstructive sleep apnea (adult) (pediatric): Secondary | ICD-10-CM

## 2019-09-17 DIAGNOSIS — E669 Obesity, unspecified: Secondary | ICD-10-CM

## 2019-09-17 DIAGNOSIS — E349 Endocrine disorder, unspecified: Secondary | ICD-10-CM | POA: Insufficient documentation

## 2019-09-17 DIAGNOSIS — Z9989 Dependence on other enabling machines and devices: Secondary | ICD-10-CM

## 2019-09-17 DIAGNOSIS — N529 Male erectile dysfunction, unspecified: Secondary | ICD-10-CM | POA: Insufficient documentation

## 2019-09-17 DIAGNOSIS — G4700 Insomnia, unspecified: Secondary | ICD-10-CM | POA: Insufficient documentation

## 2019-09-17 DIAGNOSIS — R7989 Other specified abnormal findings of blood chemistry: Secondary | ICD-10-CM | POA: Insufficient documentation

## 2019-09-17 DIAGNOSIS — G44009 Cluster headache syndrome, unspecified, not intractable: Secondary | ICD-10-CM | POA: Insufficient documentation

## 2019-09-17 DIAGNOSIS — R7309 Other abnormal glucose: Secondary | ICD-10-CM | POA: Insufficient documentation

## 2019-09-17 DIAGNOSIS — J339 Nasal polyp, unspecified: Secondary | ICD-10-CM | POA: Insufficient documentation

## 2019-09-17 DIAGNOSIS — B079 Viral wart, unspecified: Secondary | ICD-10-CM | POA: Insufficient documentation

## 2019-09-17 DIAGNOSIS — K219 Gastro-esophageal reflux disease without esophagitis: Secondary | ICD-10-CM | POA: Insufficient documentation

## 2019-09-17 LAB — T4, FREE: Free T4: 0.91 ng/dL (ref 0.60–1.60)

## 2019-09-17 MED ORDER — TESTOSTERONE CYPIONATE 100 MG/ML IM SOLN
50.0000 mg | INTRAMUSCULAR | 0 refills | Status: DC
Start: 2019-09-17 — End: 2020-04-30

## 2019-09-17 NOTE — Progress Notes (Signed)
Patient ID: Devin Ray, male   DOB: 1970/06/04, 49 y.o.   MRN: [id number]            Reason for consultation: Low testosterone   Chief complaint: Fatigue  History of Present Illness  The patient has been referred by his PCP Dr. Woody Seller  Hypogonadism was diagnosed in 2019  He has had since about 2016 complaints of fatigue, decreased motivation, decreased libido The symptoms started relatively quickly at the onset He was apparently also having issues with sleep apnea in which he was started on treatment Testosterone level in 2018 was over 300 Subsequently his testosterone levels were low and was evaluated by endocrinologist          There is no history of the following: Hot flushes or sweating, no history of long term anabolic steroid use, history of testicular injury or known mumps in childhood.   Baseline evaluation including high normal LH, normal prolactin, free testosterone level not available  He was started on testosterone injections, not clear why he did not try other forms of testosterone supplementation initially He was given 50 mg Depo testosterone which he initially took every 2 weeks Although he did feel better with starting injections he did not completely feel back to normal with his energy level or libido Also not clear why he stopped his treatment about 2 months later and did not go back for follow-up  He started back on testosterone injections again about 5/21 from his PCP and was taking the injection weekly as he felt better with doing it weekly compared to every 2 weeks However he continues to have fatigue, lethargy and somnolence and decreased libido He has not taken any injections for about a month  Testosterone level done 2 days after his injection in July was 394  Prior lab results show testosterone levels of:  LOWEST testosterone level 160, done on 01/12/2019 at 10 AM  Lab Results  Component Value Date   TESTOSTERONE 184 (L) 11/10/2017   TESTOSTERONE  230 (L) 07/19/2017    Prolactin level: 6.7 done in 2019  No results found for: [place] LH was 7.2 done on 12/10/2017, previously 4.2              Allergies as of 09/17/2019   No Known Allergies     Medication List       Accurate as of September 17, 2019  2:54 PM. If you have any questions, ask your nurse or doctor.        pantoprazole 40 MG tablet Commonly known as: PROTONIX Take 1 tablet (40 mg total) by mouth daily.   SYRINGE 3CC/18GX1-1/2" 18G X 1-1/2" 3 ML Misc Use to draw testosterone   SYRINGE 3CC/20GX1" 20G X 1" 3 ML Misc Use to inject testosterone   testosterone cypionate 100 MG/ML injection Commonly known as: DEPOTESTOTERONE CYPIONATE Inject 0.5 mLs (50 mg total) into the muscle every 7 (seven) days. For IM use only       Allergies: No Known Allergies  Past Medical History:  Diagnosis Date  . Cluster headache   . Depression   . ED (erectile dysfunction)   . GERD (gastroesophageal reflux disease)   . Hypertension   . Insomnia   . Low testosterone   . Nasal polyp   . SBO (small bowel obstruction) ([place]) 10/12/2017  . Sleep apnea     Past Surgical History:  Procedure Laterality Date  . HEMORRHOID SURGERY      No family history on file.  Social History:  reports that he has been smoking cigarettes. He started smoking about 34 years ago. He has a 46.50 pack-year smoking history. He has quit using smokeless tobacco.  His smokeless tobacco use included snuff. He reports previous alcohol use. He reports that he does not use drugs.  Review of Systems  Constitutional:       His maximum weight was 270, subsequently had lost weight but in the last couple of months has gained back about 14 pounds  HENT: Negative for headaches.        Previously has history of cluster headaches, has had only one episode in the last year  Respiratory: Negative for shortness of breath.        He has had sleep apnea treated with CPAP, this has resolved his snoring history but he  still feels sleepy during the daytime.  Currently not followed by any physician  Cardiovascular: Negative for leg swelling.  Endocrine: Positive for fatigue and decreased libido.       Not diagnosed with diabetes currently but random blood sugar was 225 in 1/21, most recent blood sugar 104 in 7/21, no A1c available  Genitourinary: Negative for slow stream.  Musculoskeletal: Positive for joint pain.  Skin: Negative for rash.  Neurological: Negative for weakness.  Psychiatric/Behavioral: Positive for depressed mood.       He feels a little depressed only because of continued fatigue.  Previously did not benefit from Wellbutrin      General Examination:   BP 130/80 (BP Location: Left Arm, Patient Position: Sitting, Cuff Size: Large)   Pulse 86   Ht 5\' 11"  (1.803 m)   Wt 256 lb 12.8 oz (116.5 kg)   SpO2 93%   BMI 35.82 kg/m   GENERAL APPEARANCE  generalized obesity present.         SKIN: normal, no rash or pigmentation.        HEENT: Oral mucosa normal.           EYES: normal external appearance of eyes, Fundii benign.           NECK: no lymphadenopathy.  Questionable small left thyroid lobe enlargement felt.         CHEST: Gynecomastia present bilaterally, relatively modest amount of glandular tissue, also has fatty tissue  LUNGS: clear to auscultation bilaterally, no wheezes, rhonchi, rales.          HEART: normal S1 And S2, no S3, S4, murmur or click.          ABDOMEN: no hepatosplenomegaly, no mass palpated, soft and not distended  MALE GENITOURINARY:  Testicular size approximately: Left testicle 3 cm and right testicle 3.5 cm.     MUSCULOSKELETAL No enlargement or deformity of joints.         EXTREMITIES: no clubbing, no edema.        NEUROLOGIC EXAM:  Biceps reflexes show normal relaxation  Assessment/ Plan:  Hypogonadism, symptomatic  Etiology is 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 with 2 levels of normal LH Also has likely metabolic syndrome with his obesity  and family history of diabetes  He has only been on testosterone injections as he may have had difficulty affording testosterone gel along with unlikely good compliance with applying the testosterone gels in the mornings with his schedule Although he has had therapeutic levels with 50 mg testosterone injections weekly as of July he has not felt back to normal and continues to have significant lethargy and somnolence  May also be a candidate for [employer]  although this appears to be nonreimbursable by his insurance plan  Most likely his continued symptoms are related to inadequately treated sleep apnea and/or depression  Although he has had a normal free T4 level in the past and this needs to be rechecked to rule out secondary hypothyroidism  Recommendations:  Restart testosterone injections 50 mg weekly and he will need to have his prescription sent again  For more accurate measurement will have him use the 100 mg/mL concentration and use 0.5 mL on each injection  Needs follow-up testosterone level in 6 weeks and he can do this on a Friday to determine the trough level  Recheck testosterone level today along with free T4.  Also since he has not had a free testosterone done previously we will check this instead of total testosterone  Follow-up with PCP regarding referral to pulmonologist for management of sleep apnea  Also may be a candidate for antidepressant other than Wellbutrin  Needs monitoring for diabetes and A1c to be checked unless available from PCP office  A copy of the consultation note has been sent to the referring physician   [name] 09/17/2019, 2:54 PM     Note: This office note was prepared with Dragon voice recognition system technology. Any transcriptional errors that result from this process are unintentional.

## 2019-09-17 NOTE — Patient Instructions (Signed)
0.5 ml weekly shot

## 2019-09-19 ENCOUNTER — Other Ambulatory Visit: Payer: Self-pay

## 2019-09-19 NOTE — Telephone Encounter (Signed)
Spoke with patient and he is going to speak to [employer] and his insurance.

## 2019-09-19 NOTE — Telephone Encounter (Signed)
Please advise 

## 2019-09-19 NOTE — Telephone Encounter (Signed)
Please have patient find out what is covered by his insurance

## 2019-09-20 LAB — TESTOSTERONE, FREE, TOTAL, SHBG
Sex Hormone Binding: 18.6 nmol/L (ref 16.5–55.9)
Testosterone, Free: 9.9 pg/mL (ref 6.8–21.5)
Testosterone: 234 ng/dL — ABNORMAL LOW (ref 264–916)

## 2019-09-21 ENCOUNTER — Telehealth: Payer: Self-pay | Admitting: Internal Medicine

## 2019-09-21 NOTE — Telephone Encounter (Signed)
Pt wanted you all to know he was able to get prescription refilled. He said he spoke to someone earlier this week and he just wanted to let you know he has the refill.

## 2019-10-24 IMAGING — CT CT MAXILLOFACIAL W/O CM
3 series · 13 of 47 positions shown, 15 images · non-contrast
Comparison: None.

CLINICAL DATA: 47 y/o  M; chronic sinusitis.

EXAM:
CT MAXILLOFACIAL WITHOUT CONTRAST
TECHNIQUE: Multidetector CT images of the paranasal sinuses were obtained using
the standard protocol without intravenous contrast.

[Series 2: standard · axial · 0.38mm/px · z∈[+1539,+1633]mm · 7 of 114 slices shown, 9 images]
[im 12/114  brain]
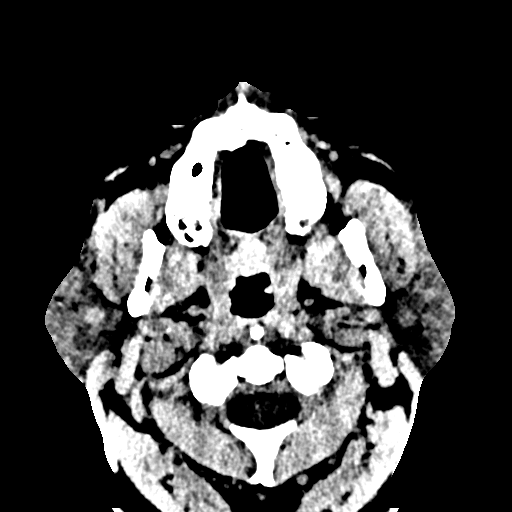
[im 12/114  bone]
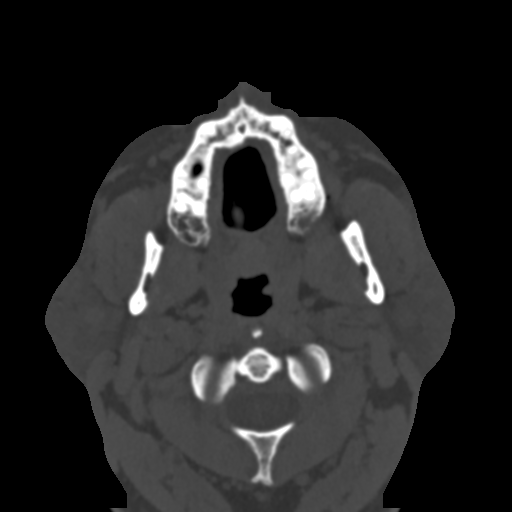
[im 28/114  bone]
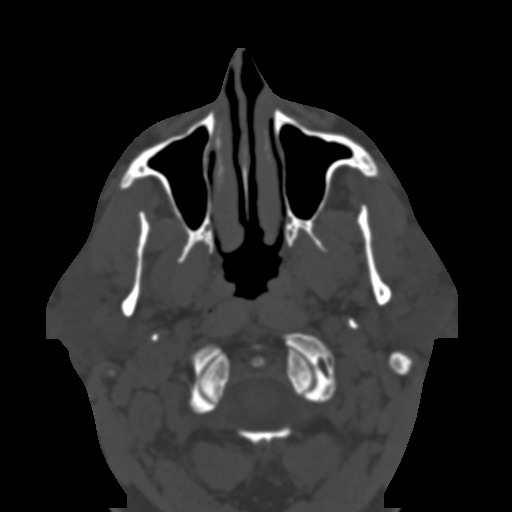
[im 43/114  bone]
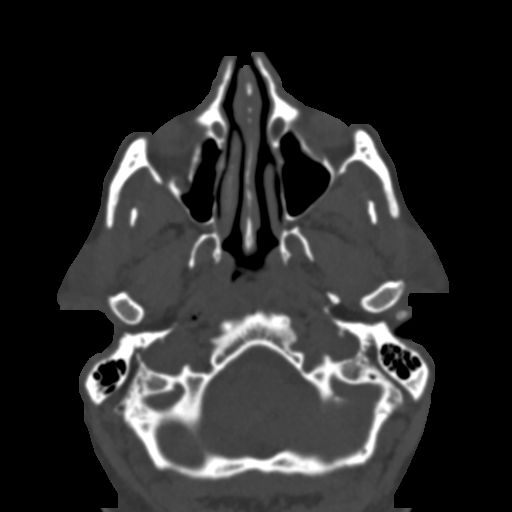
[im 59/114  bone]
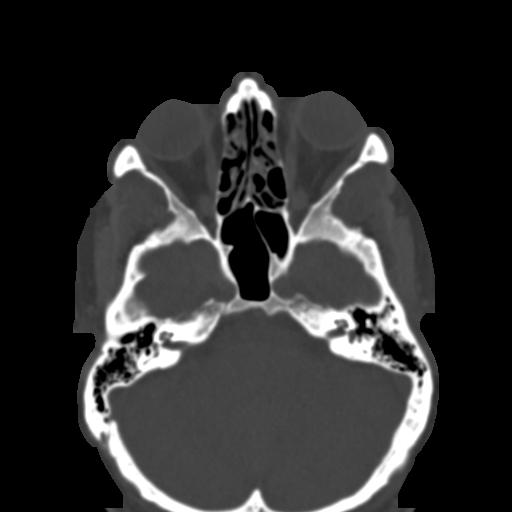
[im 75/114  brain]
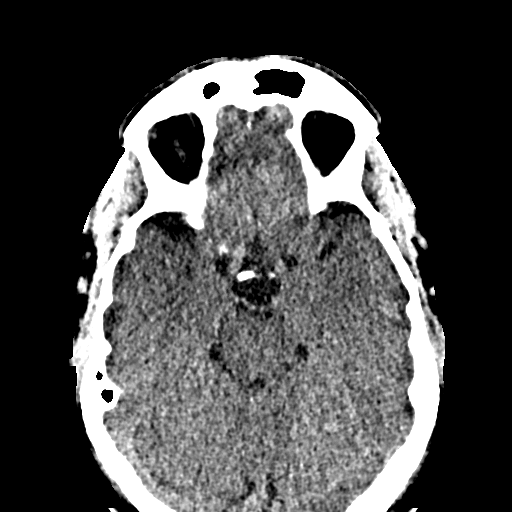
[im 75/114  bone]
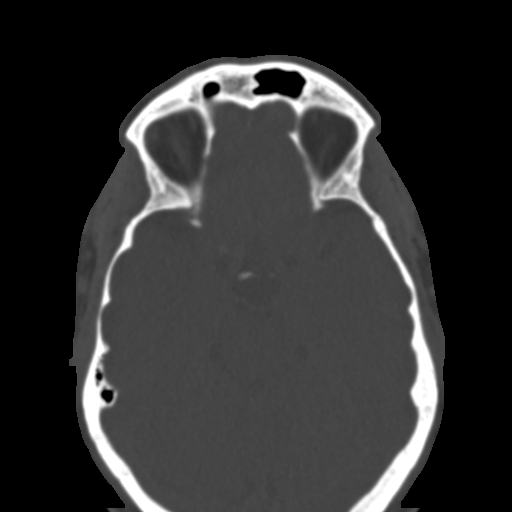
[im 90/114  bone]
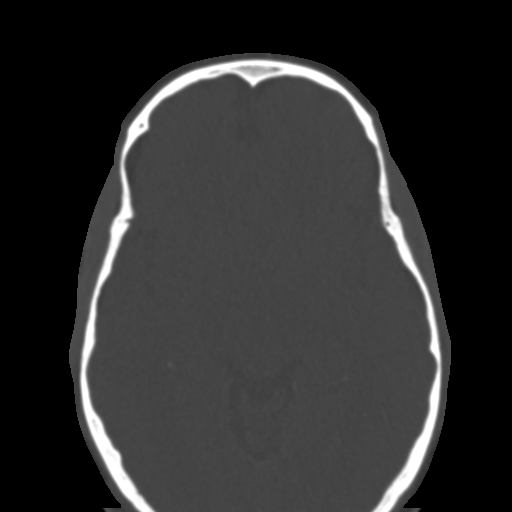
[im 106/114  bone]
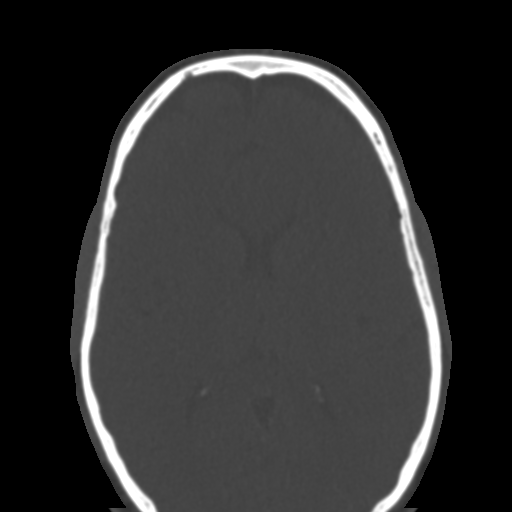

[Series 4: coronal · coronal · 0.25mm/px · 3 of 107 slices shown]
[im 36/107  bone]
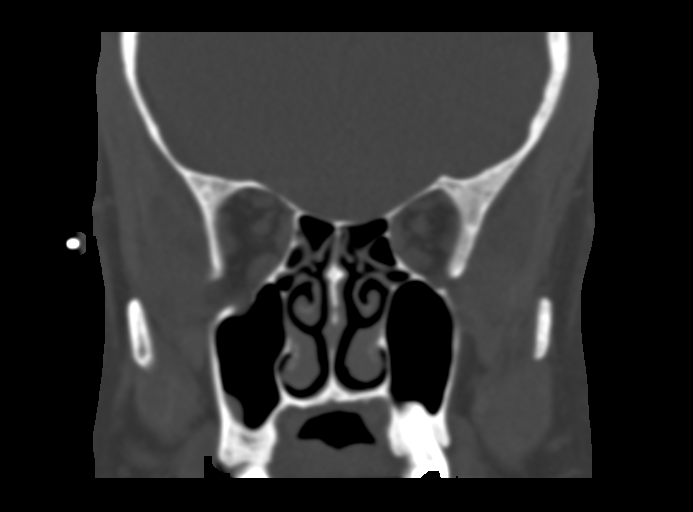
[im 48/107  bone]
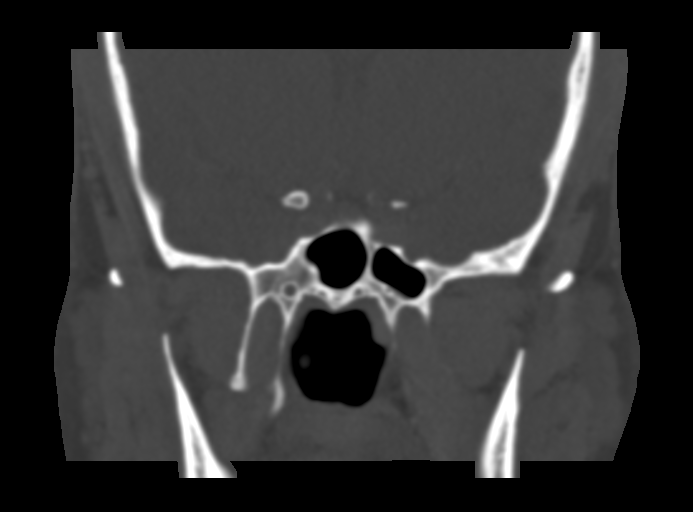
[im 59/107  bone]
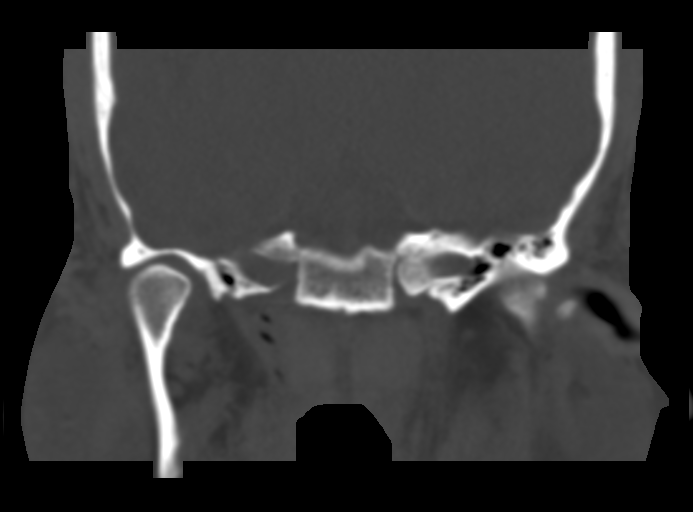

[Series 5: sagittal · sagittal · 0.23mm/px · 3 of 101 slices shown]
[im 34/101  bone]
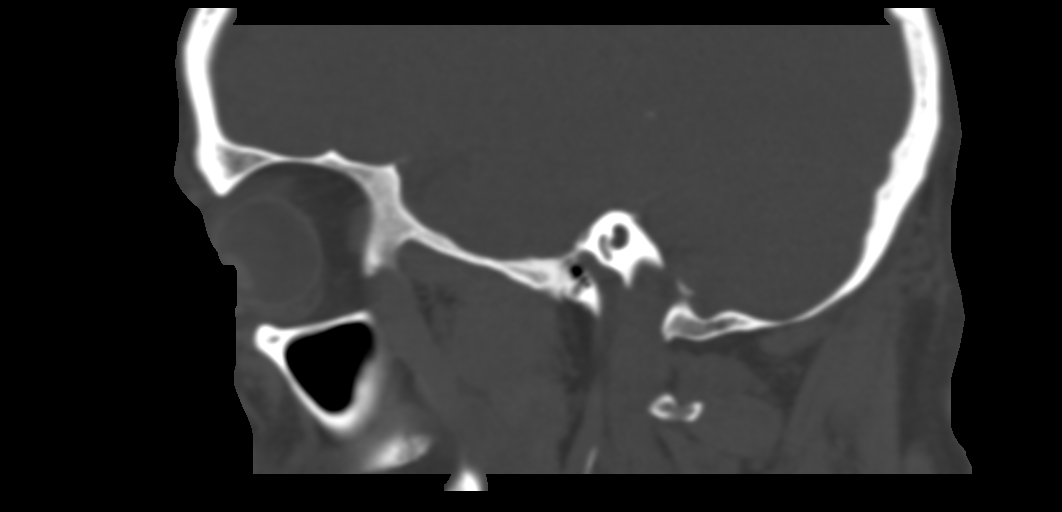
[im 51/101  bone]
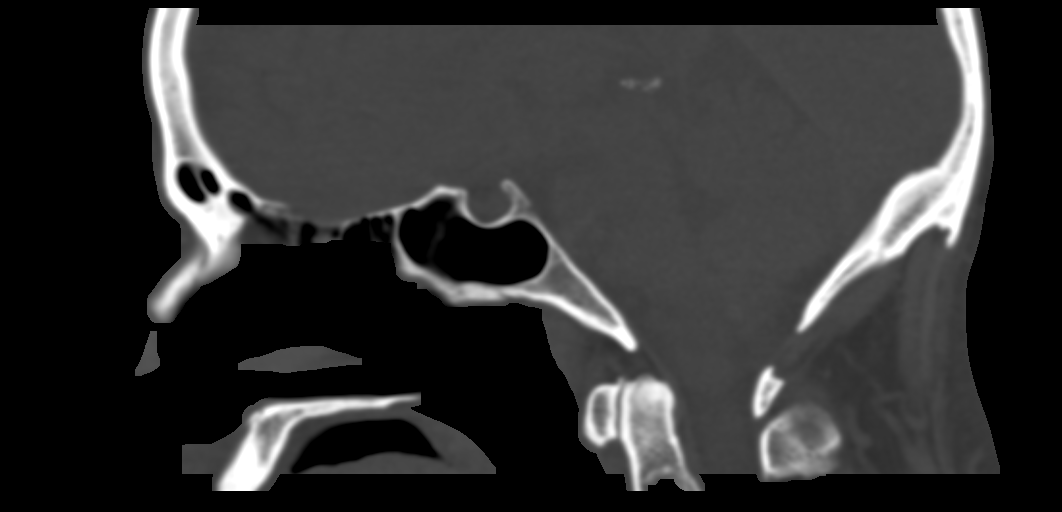
[im 67/101  bone]
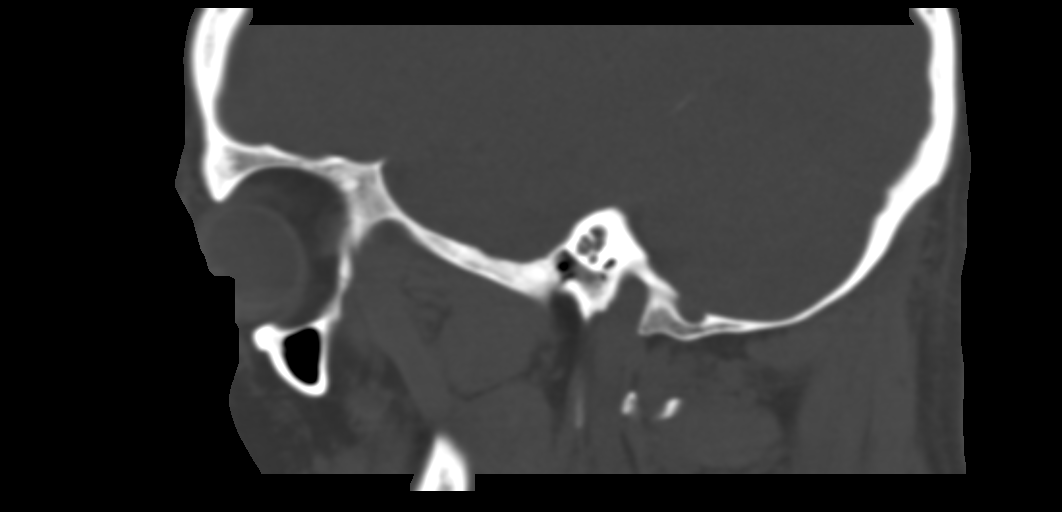

[13 of 47 positions shown; findings below may reference images not displayed]

FINDINGS: Paranasal sinuses:

Frontal: Mucosal thickening of the frontal sinus antrum bilaterally.
Patent right frontal sinus drainage pathway. Partial opacification
of left frontal sinus drainage pathway.

Ethmoid: Mild mucosal thickening of anterior and posterior ethmoid
air cells bilaterally.

Maxillary: Mild mucosal thickening of the right maxillary sinus.
Normal aeration of the left maxillary sinus.

Sphenoid: Normally aerated. Patent sphenoethmoidal recesses.

Right ostiomeatal unit: Patent.

Left ostiomeatal unit: Patent.

Nasal passages: Patent. Intact nasal septum is midline.

Anatomy: No pneumatization superior to anterior ethmoid notches.
Symmetric and intact olfactory grooves and fovea ethmoidalis, Keros
II (4-7mm). Sellar sphenoid pneumatization pattern. No dehiscence of
optic canals. No onodi cell.

Other: Orbits and intracranial compartment are unremarkable. Visible
mastoid air cells are normally aerated. High-riding right jugular
bulb with thin sigmoid plate.
IMPRESSION: Mild mucosal thickening of frontal, ethmoid, and the right maxillary
sinus. Patent sinus drainage pathways. No sinus fluid levels.

## 2019-11-04 NOTE — Progress Notes (Addendum)
Patient ID: Devin Ray, male   DOB: [birth year], 49 y.o.   MRN: [id number]            Reason for   : Low testosterone   Chief complaint: Fatigue  History of Present Illness   Hypogonadism was diagnosed in 2019  Background history: He has had since about 2016 complaints of fatigue, decreased motivation, decreased libido The symptoms started relatively quickly at the onset He was apparently also having issues with sleep apnea in which he was started on treatment Testosterone level in 2018 was over 300 Subsequently his testosterone levels were low and was evaluated by endocrinologist There is no history of the following: Hot flushes or sweating, no history of long term anabolic steroid use, history of testicular injury or known mumps in childhood.   Baseline evaluation including high normal LH, normal prolactin, free testosterone level not available  He was started on testosterone injections, not clear why he did not try other forms of testosterone supplementation initially Although he did feel better with starting injections he did not completely feel back to normal with his energy level or libido Also not clear why he stopped his treatment about 2 months later and did not go back for follow-up  He started back on testosterone injections, 50 mg every 2 weeks again and 5/21 from his PCP and was taking the injection weekly as he felt better with doing it weekly compared to every 2 weeks. Testosterone level done 2 days after his injection in July was 394  RECENT HISTORY: He was first seen in consultation in 9/21 with complaints of  fatigue, lethargy and somnolence and decreased libido He had not taken any injections for about a month and his afternoon testosterone was 234 although free testosterone was normal at 9.9  With starting back on 50 mg weekly office Depo testosterone he says energy level has improved from about a 3 to about 7 on a scale of 1-10.  However still has  decreased libido  Prior lab results show testosterone levels of:  LOWEST testosterone level 160, done on 01/12/2019 at 10 AM  Lab Results  Component Value Date   TESTOSTERONE 234 (L) 09/17/2019   TESTOSTERONE 184 (L) 11/10/2017   TESTOSTERONE 230 (L) 07/19/2017    Prolactin level: 6.7 done in 2019  No results found for: [place]  LH was 7.2 done on 12/10/2017, previously 4.2              Allergies as of 11/05/2019   No Known Allergies     Medication List       Accurate as of November 05, 2019  8:22 AM. If you have any questions, ask your nurse or doctor.        pantoprazole 40 MG tablet Commonly known as: PROTONIX Take 1 tablet (40 mg total) by mouth daily.   SYRINGE 3CC/18GX1-1/2" 18G X 1-1/2" 3 ML Misc Use to draw testosterone   SYRINGE 3CC/20GX1" 20G X 1" 3 ML Misc Use to inject testosterone   testosterone cypionate 100 MG/ML injection Commonly known as: DEPOTESTOTERONE CYPIONATE Inject 0.5 mLs (50 mg total) into the muscle every 7 (seven) days. For IM use only       Allergies: No Known Allergies  Past Medical History:  Diagnosis Date  . Cluster headache   . Depression   . ED (erectile dysfunction)   . GERD (gastroesophageal reflux disease)   . Hypertension   . Insomnia   . Low testosterone   . Nasal  polyp   . SBO (small bowel obstruction) ([place]) 10/12/2017  . Sleep apnea     Past Surgical History:  Procedure Laterality Date  . HEMORRHOID SURGERY      No family history on file.  Social History:  reports that he has been smoking cigarettes. He started smoking about 35 years ago. He has a 46.50 pack-year smoking history. He has quit using smokeless tobacco.  His smokeless tobacco use included snuff. He reports previous alcohol use. He reports that he does not use drugs.  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 and symptomatic  Also has likely metabolic syndrome with his obesity and family history of diabetes  He has only been on testosterone injections for about a month now but he does subjectively seem to be doing better Currently on 50 mg weekly and last injection was 5 days ago   Recommendations:  We will check testosterone level and decide on dosage and follow-up  Recheck CBC also  Recommend that he follow-up with his PCP regarding evaluation of his sleep apnea and consideration of using Wellbutrin for fatigue and decreased libido    [name] 11/05/2019, 8:22 AM   Addendum: Free testosterone relatively better at 11.8 and total testosterone 319.  To continue same dose of testosterone   Note: This office note was prepared with Estate agent. Any transcriptional errors that result from this process are unintentional.

## 2019-11-05 ENCOUNTER — Other Ambulatory Visit: Payer: Self-pay

## 2019-11-05 ENCOUNTER — Ambulatory Visit (INDEPENDENT_AMBULATORY_CARE_PROVIDER_SITE_OTHER): Payer: 59 | Admitting: Endocrinology

## 2019-11-05 ENCOUNTER — Encounter: Payer: Self-pay | Admitting: Endocrinology

## 2019-11-05 VITALS — BP 130/88 | HR 78 | Ht 71.0 in | Wt 268.0 lb

## 2019-11-05 DIAGNOSIS — E349 Endocrine disorder, unspecified: Secondary | ICD-10-CM | POA: Diagnosis not present

## 2019-11-05 LAB — CBC
HCT: 42.3 % (ref 39.0–52.0)
Hemoglobin: 14.6 g/dL (ref 13.0–17.0)
MCHC: 34.6 g/dL (ref 30.0–36.0)
MCV: 94.8 fl (ref 78.0–100.0)
Platelets: 204 10*3/uL (ref 150.0–400.0)
RBC: 4.46 Mil/uL (ref 4.22–5.81)
RDW: 14 % (ref 11.5–15.5)
WBC: 8.4 10*3/uL (ref 4.0–10.5)

## 2019-11-07 LAB — TESTOSTERONE, FREE, TOTAL, SHBG
Sex Hormone Binding: 21.2 nmol/L (ref 16.5–55.9)
Testosterone, Free: 11.8 pg/mL (ref 6.8–21.5)
Testosterone: 319 ng/dL (ref 264–916)

## 2019-11-07 NOTE — Progress Notes (Signed)
Please call to let patient know that the lab results are at target, continue same dose of testosterone.  Keep follow-up as scheduled

## 2020-03-12 NOTE — Progress Notes (Signed)
Patient ID: Devin Ray, male   DOB: Oct 15, 1970, 50 y.o.   MRN: [id number]            Reason for   : Low testosterone   Chief complaint: Fatigue  History of Present Illness   Hypogonadism was diagnosed in 2019  Background history: He has had since about 2016 complaints of fatigue, decreased motivation, decreased libido The symptoms started relatively quickly at the onset He was apparently also having issues with sleep apnea in which he was started on treatment Testosterone level in 2018 was over 300 Subsequently his testosterone levels were low and was evaluated by endocrinologist There is no history of the following: Hot flushes or sweating, no history of long term anabolic steroid use, history of testicular injury or known mumps in childhood.   Baseline evaluation including high normal LH, normal prolactin, free testosterone level not available  He was started on testosterone injections, not clear why he did not try other forms of testosterone supplementation initially Although he did feel better with starting injections he did not completely feel back to normal with his energy level or libido Also not clear why he stopped his treatment about 2 months later and did not go back for follow-up  He started back on testosterone injections, 50 mg every 2 weeks again and 5/21 from his PCP and was taking the injection weekly as he felt better with doing it weekly compared to every 2 weeks. Testosterone level done 2 days after his injection in July was 394  RECENT HISTORY: He was first seen in consultation in 9/21 with complaints of  fatigue, lethargy and somnolence and decreased libido He had not taken any injections for about a month and his afternoon testosterone was 234 although free testosterone was normal at 9.9  With starting back on 50 mg weekly of Depo testosterone he did have some improvement in his energy level and fatigue  Follow-up in 11/21 his free testosterone was  improved at 11.8 and he was continued on the same dose He again is complaining of significant fatigability as well as getting up feeling tired. However still has decreased libido His last injection was 6 days ago  Labs:  LOWEST testosterone level 160, done on 01/12/2019 at 10 AM  Lab Results  Component Value Date   TESTOSTERONE 319 11/05/2019   TESTOSTERONE 234 (L) 09/17/2019   TESTOSTERONE 184 (L) 11/10/2017   TESTOSTERONE 230 (L) 07/19/2017    Prolactin level: 6.7 done in 2019  No results found for: [place]  LH was 7.2 done on 12/10/2017, previously 4.2          Lab Results  Component Value Date   HGB 14.6 11/05/2019      Allergies as of 03/13/2020   No Known Allergies     Medication List       Accurate as of March 13, 2020  3:07 PM. If you have any questions, ask your nurse or doctor.        pantoprazole 40 MG tablet Commonly known as: PROTONIX Take 1 tablet (40 mg total) by mouth daily.   SYRINGE 3CC/18GX1-1/2" 18G X 1-1/2" 3 ML Misc Use to draw testosterone   SYRINGE 3CC/20GX1" 20G X 1" 3 ML Misc Use to inject testosterone   testosterone cypionate 100 MG/ML injection Commonly known as: DEPOTESTOTERONE CYPIONATE Inject 0.5 mLs (50 mg total) into the muscle every 7 (seven) days. For IM use only       Allergies: No Known Allergies  Past  Medical History:  Diagnosis Date  . Cluster headache   . Depression   . ED (erectile dysfunction)   . GERD (gastroesophageal reflux disease)   . Hypertension   . Insomnia   . Low testosterone   . Nasal polyp   . SBO (small bowel obstruction) ([place]) 10/12/2017  . Sleep apnea     Past Surgical History:  Procedure Laterality Date  . HEMORRHOID SURGERY      No family history on file.  Social History:  reports that he has been smoking cigarettes. He started smoking about 35 years ago. He has a 46.50 pack-year smoking history. He has quit using smokeless tobacco.  His smokeless tobacco use included snuff. He reports  previous alcohol use. He reports that he does not use drugs.  Review of Systems   Previous history of sleep apnea, he has not pursued referral or management with his PCP He says he wakes up feeling tired Also is exhausted in the evening and goes to sleep early at times Also has decreased motivation  Fasting glucose 104 done in 2021 by PCP  Has normal free T4 level:  Lab Results  Component Value Date   TSH 1.880 07/19/2017   FREET4 0.91 09/17/2019   FREET4 1.34 07/19/2017     General Examination:   BP 120/80 (BP Location: Left Arm, Patient Position: Sitting)   Pulse 71   Ht 5\' 11"  (1.803 m)   Wt 271 lb 6.4 oz (123.1 kg)   BMI 37.85 kg/m   Mild acanthosis of his armpits and anterior neck present  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 and symptomatic  He did have a relatively low testosterone level, as low as 160 at baseline but more recently his free testosterone was not documented to be low  Again even though he has had therapeutic levels with 50 mg weekly of the testosterone he still complains of excessive fatigue and difficulty functioning He has fatigue when he wakes up and is not rested overnight Has not pursued evaluation for sleep apnea as discussed before Explained to him that his fatigue is unlikely to be more related to hypogonadism and only if his testosterone level is low normal we will increase his dose   Recommendations:  Today we will check free and total testosterone level and decide on dosage and follow-up  Monitor CBC also  Pulmonary consultation at his request  Recommend that he follow-up with his PCP regarding evaluation of his fatigue, possible depression and other issues; may benefit from Wellbutrin for fatigue and decreased libido  We will check glucose also    [name] 03/13/2020, 3:07 PM      Note: This office note was prepared with Dragon voice recognition system technology. Any transcriptional  errors that result from this process are unintentional.

## 2020-03-13 ENCOUNTER — Other Ambulatory Visit: Payer: Self-pay

## 2020-03-13 ENCOUNTER — Ambulatory Visit (INDEPENDENT_AMBULATORY_CARE_PROVIDER_SITE_OTHER): Payer: 59 | Admitting: Endocrinology

## 2020-03-13 ENCOUNTER — Encounter: Payer: Self-pay | Admitting: Endocrinology

## 2020-03-13 VITALS — BP 120/80 | HR 71 | Ht 71.0 in | Wt 271.4 lb

## 2020-03-13 DIAGNOSIS — G4730 Sleep apnea, unspecified: Secondary | ICD-10-CM | POA: Diagnosis not present

## 2020-03-13 DIAGNOSIS — E349 Endocrine disorder, unspecified: Secondary | ICD-10-CM | POA: Diagnosis not present

## 2020-03-13 DIAGNOSIS — R7301 Impaired fasting glucose: Secondary | ICD-10-CM

## 2020-03-13 LAB — CBC
HCT: 43.4 % (ref 39.0–52.0)
Hemoglobin: 15 g/dL (ref 13.0–17.0)
MCHC: 34.7 g/dL (ref 30.0–36.0)
MCV: 94.3 fl (ref 78.0–100.0)
Platelets: 213 10*3/uL (ref 150.0–400.0)
RBC: 4.6 Mil/uL (ref 4.22–5.81)
RDW: 14.3 % (ref 11.5–15.5)
WBC: 5.8 10*3/uL (ref 4.0–10.5)

## 2020-03-14 LAB — GLUCOSE, RANDOM: Glucose, Bld: 90 mg/dL (ref 70–99)

## 2020-03-15 LAB — TESTOSTERONE, FREE, TOTAL, SHBG
Sex Hormone Binding: 24.5 nmol/L (ref 16.5–55.9)
Testosterone, Free: 9.1 pg/mL (ref 6.8–21.5)
Testosterone: 296 ng/dL (ref 264–916)

## 2020-04-28 ENCOUNTER — Encounter: Payer: Self-pay | Admitting: Pulmonary Disease

## 2020-04-28 ENCOUNTER — Ambulatory Visit (INDEPENDENT_AMBULATORY_CARE_PROVIDER_SITE_OTHER): Payer: 59 | Admitting: Pulmonary Disease

## 2020-04-28 ENCOUNTER — Other Ambulatory Visit: Payer: Self-pay

## 2020-04-28 VITALS — BP 172/102 | HR 78 | Temp 98.4°F | Ht 71.0 in | Wt 263.8 lb

## 2020-04-28 DIAGNOSIS — G4733 Obstructive sleep apnea (adult) (pediatric): Secondary | ICD-10-CM | POA: Diagnosis not present

## 2020-04-28 NOTE — Progress Notes (Signed)
[facility]  Chief Complaint  Patient presents with  . Consult    Sleep consult- currently cpap [facility]    Constitutional:  BP (!) 172/102 (BP Location: Left Arm, Cuff Size: Normal)   Pulse 78   Temp 98.4 F (36.9 C) (Other (Comment)) Comment (Src): wrist  Ht 5\' 11"  (1.803 m)   Wt 263 lb 12.8 oz (119.7 kg)   SpO2 96% Comment: Room air  BMI 36.79 kg/m   Past Medical History:  RBBB, Cluster Headaches, ED, Depression, GERD, HTN, Low T, Nasal polyp, SBO  Past Surgical History:  He  has a past surgical history that includes Hemorrhoid surgery.  Brief Summary:  Devin Ray is a 50 y.o. male smoker with obstructive sleep apnea.       Subjective:   He had sleep study in 2014.  Found to have severe sleep apnea.  He thinks his CPAP is set at 13 cm H2O.  He has gained about 70 lbs over the past several years.  He is feeling more sleepy and fatigued during the day, and falls asleep in the evening after work.  He has a full face mask, and mask fit is okay.  He goes to sleep at 11 pm.  He falls asleep 10 minutes.  He wakes up some times to use the bathroom.  He gets out of bed at 545 am.  He feels tired in the morning.  He denies morning headache.  He tried melatonin to help sleep, but this didn't work.  Drinks several cups of coffee during the day.  He denies sleep walking, sleep talking, bruxism, or nightmares.  There is no history of restless legs.  He denies sleep hallucinations, sleep paralysis, or cataplexy.  The Epworth score is 15 out of 24.  He smokes about 1.5 packs per day.  He tried wellbutrin before.  This helped, but he quit using it.  Physical Exam:   Appearance - well kempt   ENMT - no sinus tenderness, no oral exudate, no LAN, Mallampati 4 airway, no stridor, raspy voice  Respiratory - equal breath sounds bilaterally, no wheezing or rales  CV - s1s2 regular rate and rhythm, no murmurs  Ext - no  clubbing, no edema  Skin - no rashes  Psych - normal mood and affect   Pulmonary testing:   PFT 08/04/16 >> FEV1 4.08 (94%), FEV1% 80, TLC 7.20 (97%), DLCO 90%  Chest Imaging:    Sleep Tests:   PSG 05/03/12 >> AHI 88.9, SpO2 low 74%  Cardiac Tests:   Echo 02/09/19 >> EF 55 to 60%  Social History:  He  reports that he has been smoking cigarettes. He started smoking about 35 years ago. He has a 46.50 pack-year smoking history. He has quit using smokeless tobacco.  His smokeless tobacco use included snuff. He reports previous alcohol use. He reports that he does not use drugs.  Family History:  His family history is negative for sleep apnea.     Assessment/Plan:   Obstructive sleep apnea. - he reports compliance with CPAP and benefit from therapy - he uses [facility] for his DME - he continues to have daytime fatigue and sleepiness; he has gained about 70 lbs and this could contribute to worsening of his sleep apnea - will see if we can get a copy of his last 30 days CPAP download - his current CPAP is more than 50 yrs old, not functioning properly and  not amenable to repair - will arrange for new auto CPAP 8 to 18 cm H2O  Tobacco abuse. - reviewed options to help with smoking cessation - wellbutrin worked before - he would like to address sleep apnea therapy first and then reassess options for smoking cessation  Obesity. - discussed how weight can impact sleep and risk for sleep disordered breathing - discussed options to assist with weight loss: combination of diet modification, cardiovascular and strength training exercises  Cardiovascular risk. - had an extensive discussion regarding the adverse health consequences related to untreated sleep disordered breathing - specifically discussed the risks for hypertension, coronary artery disease, cardiac dysrhythmias, cerebrovascular disease, and diabetes - lifestyle modification discussed  Safe driving practices. -  discussed how sleep disruption can increase risk of accidents, particularly when driving - safe driving practices were discussed   Time Spent Involved in Patient Care on Day of Examination:  33 minutes  Follow up:  Patient Instructions  Will have [facility] send your last 30 days CPAP download and see if you are eligible for a new auto CPAP machine  Follow up in 3 months   Medication List:   Allergies as of 04/28/2020   No Known Allergies     Medication List       Accurate as of April 28, 2020 10:09 AM. If you have any questions, ask your nurse or doctor.        pantoprazole 40 MG tablet Commonly known as: PROTONIX Take 1 tablet (40 mg total) by mouth daily.   SYRINGE 3CC/18GX1-1/2" 18G X 1-1/2" 3 ML Misc Use to draw testosterone   SYRINGE 3CC/20GX1" 20G X 1" 3 ML Misc Use to inject testosterone   testosterone cypionate 100 MG/ML injection Commonly known as: DEPOTESTOTERONE CYPIONATE Inject 0.5 mLs (50 mg total) into the muscle every 7 (seven) days. For IM use only       Signature:  Chesley Mires, MD [facility] Pager - [phone] 04/28/2020, 10:09 AM

## 2020-04-28 NOTE — Patient Instructions (Signed)
Will have [facility] send your last 30 days CPAP download and see if you are eligible for a new auto CPAP machine  Follow up in 3 months

## 2020-04-29 ENCOUNTER — Other Ambulatory Visit: Payer: Self-pay | Admitting: "Endocrinology

## 2020-04-29 ENCOUNTER — Other Ambulatory Visit: Payer: Self-pay | Admitting: Endocrinology

## 2020-04-30 ENCOUNTER — Telehealth: Payer: Self-pay | Admitting: *Deleted

## 2020-05-01 ENCOUNTER — Other Ambulatory Visit: Payer: Self-pay | Admitting: *Deleted

## 2020-05-01 MED ORDER — "SYRINGE 18G X 1-1/2"" 3 ML MISC": 0 refills | Status: DC

## 2020-05-01 MED ORDER — "SYRINGE 20G X 1"" 3 ML MISC": 0 refills | Status: DC

## 2020-05-01 NOTE — Telephone Encounter (Signed)
Testosterone was sent on 04/30/20 by Dr. Dwyane Dee, needles were faxed today.

## 2020-05-01 NOTE — Telephone Encounter (Signed)
Pt's wife called to request:  MEDICATION: needles and testosterone   PHARMACY:   [place], [place] - [place] Phone:  [phone]  Fax:  [phone]      HAS THE PATIENT CONTACTED [facility]?  yes  IS THIS A 90 DAY SUPPLY : not sure  IS PATIENT OUT OF MEDICATION: yes  IF NOT; HOW MUCH IS LEFT:   LAST APPOINTMENT DATE: @4 /26/2022  NEXT APPOINTMENT DATE:@9 /15/2022  DO WE HAVE YOUR PERMISSION TO LEAVE A DETAILED MESSAGE?:  OTHER COMMENTS:    **Let patient know to contact pharmacy at the end of the day to make sure medication is ready. **  ** Please notify patient to allow 48-72 hours to process**  **Encourage patient to contact the pharmacy for refills or they can request refills through [facility]**

## 2020-05-05 ENCOUNTER — Other Ambulatory Visit: Payer: Self-pay | Admitting: Endocrinology

## 2020-05-05 ENCOUNTER — Other Ambulatory Visit: Payer: Self-pay

## 2020-05-05 ENCOUNTER — Telehealth: Payer: Self-pay | Admitting: *Deleted

## 2020-05-05 NOTE — Telephone Encounter (Signed)
This was sent last week, he needs to check with the pharmacy

## 2020-05-05 NOTE — Telephone Encounter (Signed)
Noted  

## 2020-05-06 ENCOUNTER — Other Ambulatory Visit: Payer: Self-pay | Admitting: Endocrinology

## 2020-05-06 MED ORDER — TESTOSTERONE CYPIONATE 200 MG/ML IM SOLN: INTRAMUSCULAR | 2 refills | Status: DC

## 2020-05-06 NOTE — Telephone Encounter (Signed)
I spoke with the pharmacy, they said they never received the Rx sent on the 27th, Dr. Dwyane Dee will resend it to them today.

## 2020-05-06 NOTE — Telephone Encounter (Signed)
Pt called because he says the pharmacy has his medication but it needs approval. I asked the pt if he was referring to a PA approval and he didn't know, he's just very confused because the pharmacy is telling him one thing and then changing it the next. He said we can give the pharmacy a call to clarify but he says they just need approval from Dr Dwyane Dee for the testosterone.   [facility], [place] Phone:  [phone]  Fax:  [phone]

## 2020-09-18 ENCOUNTER — Other Ambulatory Visit: Payer: Self-pay

## 2020-09-18 ENCOUNTER — Ambulatory Visit (INDEPENDENT_AMBULATORY_CARE_PROVIDER_SITE_OTHER): Payer: 59 | Admitting: Endocrinology

## 2020-09-18 VITALS — BP 158/102 | HR 82 | Ht 71.0 in | Wt 259.0 lb

## 2020-09-18 DIAGNOSIS — R5382 Chronic fatigue, unspecified: Secondary | ICD-10-CM | POA: Diagnosis not present

## 2020-09-18 DIAGNOSIS — R030 Elevated blood-pressure reading, without diagnosis of hypertension: Secondary | ICD-10-CM

## 2020-09-18 DIAGNOSIS — E349 Endocrine disorder, unspecified: Secondary | ICD-10-CM | POA: Diagnosis not present

## 2020-09-18 LAB — VITAMIN B12: Vitamin B-12: 359 pg/mL (ref 211–911)

## 2020-09-18 MED ORDER — TESTOSTERONE CYPIONATE 200 MG/ML IM SOLN: INTRAMUSCULAR | 2 refills | Status: DC

## 2020-09-18 NOTE — Progress Notes (Signed)
Patient ID: Devin Ray, male   DOB: 06-10-1970, 50 y.o.   MRN: [id number]              Chief complaint: Fatigue, follow-up of low testosterone  History of Present Illness   Hypogonadism was diagnosed in 2019  Background history: He has had since about 2016 complaints of fatigue, decreased motivation, decreased libido The symptoms started relatively quickly at the onset He was apparently also having issues with sleep apnea in which he was started on treatment Testosterone level in 2018 was over 300 Subsequently his testosterone levels were low and was evaluated by endocrinologist There is no history of the following: Hot flushes or sweating, no history of long term anabolic steroid use, history of testicular injury or known mumps in childhood.   Baseline evaluation including high normal LH, normal prolactin, free testosterone level not available  He was started on testosterone injections, not clear why he did not try other forms of testosterone supplementation initially Although he did feel better with starting injections he did not completely feel back to normal with his energy level or libido Also not clear why he stopped his treatment about 2 months later and did not go back for follow-up  He started back on testosterone injections, 50 mg every 2 weeks again and 5/21 from his PCP and was taking the injection weekly as he felt better with doing it weekly compared to every 2 weeks. Testosterone level done 2 days after his injection in July was 394  RECENT HISTORY: He was first seen in consultation in 9/21 with complaints of  fatigue, lethargy and somnolence and decreased libido He had not taken any injections for about a month and his afternoon testosterone was 234 although free testosterone was normal at 9.9  With starting on 50 mg weekly of Depo testosterone he did have some improvement in his energy level and fatigue  With his treatment is free testosterone was improved at  11.8 and he was continued on the same dose However since free testosterone was slightly lower he is now taking 60 mg weekly  He is also being treated for sleep apnea He says that his energy level fluctuates from day-to-day and some days he feels better with improved energy Had been complaining of getting up feeling tired also. Also still has decreased libido  He says he has been able to lose weight lately with making some more efforts with diet although has not been walking much lately as before  His last injection was about 10 days ago and he forgot to take it on Monday  Wt Readings from Last 3 Encounters:  09/18/20 259 lb (117.5 kg)  04/28/20 263 lb 12.8 oz (119.7 kg)  03/13/20 271 lb 6.4 oz (123.1 kg)    Labs:  LOWEST testosterone level 160, done on 01/12/2019 at 10 AM  Lab Results  Component Value Date   TESTOSTERONE 296 03/13/2020   TESTOSTERONE 319 11/05/2019   TESTOSTERONE 234 (L) 09/17/2019   TESTOSTERONE 184 (L) 11/10/2017    Prolactin level: 6.7 done in 2019  No results found for: [place]  LH was 7.2 done on 12/10/2017, previously 4.2          Lab Results  Component Value Date   HGB 15.0 03/13/2020      Allergies as of 09/18/2020   No Known Allergies      Medication List        Accurate as of September 18, 2020  2:59 PM. If you  have any questions, ask your nurse or doctor.          pantoprazole 40 MG tablet Commonly known as: PROTONIX Take 1 tablet (40 mg total) by mouth daily.   SYRINGE 3CC/18GX1-1/2" 18G X 1-1/2" 3 ML Misc Use to draw testosterone   SYRINGE 3CC/20GX1" 20G X 1" 3 ML Misc Use to inject testosterone   B-D 3CC LUER-LOK SYR 22GX1" 22G X 1" 3 ML Misc Generic drug: SYRINGE-NEEDLE (DISP) 3 ML USE TO INJECT 0.25ML IM EVERY 1 WEEK.   testosterone cypionate 200 MG/ML injection Commonly known as: DEPOTESTOSTERONE CYPIONATE INJECT 0.6 mL INTO THE MUSCLE EVERY 7 DAYS FOR IM USE ONLY        Allergies: No Known Allergies  Past  Medical History:  Diagnosis Date   Cluster headache    Depression    ED (erectile dysfunction)    GERD (gastroesophageal reflux disease)    Hypertension    Insomnia    Low testosterone    Nasal polyp    SBO (small bowel obstruction) ([place]) 10/12/2017   Sleep apnea     Past Surgical History:  Procedure Laterality Date   HEMORRHOID SURGERY      No family history on file.  Social History:  reports that he has been smoking cigarettes. He started smoking about 35 years ago. He has a 46.50 pack-year smoking history. He has quit using smokeless tobacco.  His smokeless tobacco use included snuff. He reports that he does not currently use alcohol. He reports that he does not use drugs.  Review of Systems   Sleep apnea:  Symptoms are waking up feeling tired Also is exhausted in the evening and goes to sleep early at times He is supposed to get a new CPAP machine and is waiting to be contacted by his pulmonologist  No diabetes, last glucose in the office normal  Has normal TSH and free T4 level: Last TSH in 7/22 by PCP  Lab Results  Component Value Date   TSH 1.880 07/19/2017   FREET4 0.91 09/17/2019   FREET4 1.34 07/19/2017   Not clear if he has had any B12 levels previously  BP Readings from Last 3 Encounters:  09/18/20 (!) 158/102  04/28/20 (!) 172/102  03/13/20 120/80     General Examination:   BP (!) 158/102   Pulse 82   Ht '5\' 11"'$  (1.803 m)   Wt 259 lb (117.5 kg)   SpO2 98%   BMI 36.12 kg/m     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 and symptomatic  He has had times low testosterone levels as low as 160 at baseline although inconsistent  Prior to treatment recently the free testosterone was only low normal but he was likely having symptoms  He has had some improvement in his fatigue with testosterone injections Likely has had better energy level overall with losing weight recently Also is taking treatment for sleep  apnea  Discussed that likely his decreased libido is related to his fatigue rather than inadequate testosterone He has missed his last dose of testosterone on Monday Has not had any erythrocytosis with this  Recommendations:  will check free and total testosterone level and decide on dosage with the caveat that he has been 4 days late for the injection He does need to try and take his injections every Monday regularly Recommend that he follow-up with his pulmonologist and PCP regarding management of his CPAP as well as fatigue and concomitant issues Rule out B12 deficiency today  If his testosterone level is reasonably good we will follow-up in 6 months Also will be needing to follow-up with PCP regarding his blood pressure  Sleep apnea: He was supposed to have his CPAP download transmitted to his pulmonologist from his pharmacy and he will call to facilitate this    [name] 09/18/2020, 2:59 PM      Note: This office note was prepared with Dragon voice recognition system technology. Any transcriptional errors that result from this process are unintentional.

## 2020-09-21 LAB — TESTOSTERONE, FREE, TOTAL, SHBG
Sex Hormone Binding: 18.5 nmol/L (ref 16.5–55.9)
Testosterone, Free: 10.2 pg/mL (ref 6.8–21.5)
Testosterone: 390 ng/dL (ref 264–916)

## 2020-10-06 ENCOUNTER — Other Ambulatory Visit: Payer: Self-pay

## 2020-10-06 ENCOUNTER — Ambulatory Visit (HOSPITAL_COMMUNITY)
Admission: RE | Admit: 2020-10-06 | Discharge: 2020-10-06 | Disposition: A | Discharge status: Home / Self Care | Payer: 59 | Source: Ambulatory Visit | Attending: Pulmonary Disease | Admitting: Pulmonary Disease

## 2020-10-06 ENCOUNTER — Ambulatory Visit (INDEPENDENT_AMBULATORY_CARE_PROVIDER_SITE_OTHER): Payer: 59 | Admitting: Pulmonary Disease

## 2020-10-06 ENCOUNTER — Encounter: Payer: Self-pay | Admitting: Pulmonary Disease

## 2020-10-06 VITALS — BP 132/82 | HR 74 | Temp 98.3°F | Ht 71.0 in | Wt 259.0 lb

## 2020-10-06 DIAGNOSIS — G4733 Obstructive sleep apnea (adult) (pediatric): Secondary | ICD-10-CM | POA: Diagnosis not present

## 2020-10-06 DIAGNOSIS — R0609 Other forms of dyspnea: Secondary | ICD-10-CM

## 2020-10-06 DIAGNOSIS — G4719 Other hypersomnia: Secondary | ICD-10-CM | POA: Diagnosis not present

## 2020-10-06 MED ORDER — BUPROPION HCL ER (SR) 150 MG PO TB12: 150.0000 mg | ORAL_TABLET | Freq: Two times a day (BID) | ORAL | 2 refills | Status: DC

## 2020-10-06 NOTE — Patient Instructions (Signed)
Will have our patient care coordinator check on status of new auto CPAP machine from [facility]  Bupropion (wellbutrin) for smoking cessation.  Take 1 pill daily for 3 days, then 1 pill twice daily for next 4 days.  After using medicine for a week, then stop smoking.  Stay on 1 pill twice per day after this.  You can use nicotine patches if needed to help with smoking cessation.  Will arrange for CPAP titration study followed by a multiple sleep latency test.  Will arrange for chest xray today  Will schedule pulmonary function test in [place] office and follow up there in 4 to 5 weeks

## 2020-10-06 NOTE — Progress Notes (Signed)
[facility]  Chief Complaint  Patient presents with   Follow-up    Pt says last few days hasn't been feeling as well rested. Feels it may be related to sleep apnea.      Past Surgical History:  He  has a past surgical history that includes Hemorrhoid surgery.  Past Medical History:  RBBB, Cluster Headaches, ED, Depression, GERD, HTN, Low T, Nasal polyp, SBO  Constitutional:  BP 132/82 (BP Location: Left Arm, Patient Position: Sitting)   Pulse 74   Temp 98.3 F (36.8 C) (Temporal)   Ht 5\' 11"  (1.803 m)   Wt 259 lb 0.6 oz (117.5 kg)   SpO2 98%   BMI 36.13 kg/m   Brief Summary:  Devin Ray is a 50 y.o. male smoker with obstructive sleep apnea, daytime sleepiness, and dyspnea.       Subjective:   He still hasn't received new CPAP from DME.  Uses CPAP about 6 hours per night.  No issues with mask fit.  Feels tired throughout the day.  Not have pain issues disrupting his sleep.  Denies RLS symptoms.  Doesn't feel like he has energy.  Doesn't feel depressed.  He continues to smoke cigarettes.  Has occasional cough.  Has noticed feeling more short of breath with walking.  He had cardiac testing done within the past year and reports this was okay.  He was told previously the he has  a "touch" of COPD.   Physical Exam:   Appearance - well kempt   ENMT - no sinus tenderness, no oral exudate, no LAN, Mallampati 3 airway, no stridor, raspy voice  Respiratory - equal breath sounds bilaterally, no wheezing or rales  CV - s1s2 regular rate and rhythm, no murmurs  Ext - no clubbing, no edema  Skin - no rashes  Psych - normal mood and affect    Pulmonary testing:  PFT 08/04/16 >> FEV1 4.08 (94%), FEV1% 80, TLC 7.20 (97%), DLCO 90%  Sleep Tests:  PSG 05/03/12 >> AHI 88.9, SpO2 low 74% CPAP 09/03/20 to 10/02/20 >> used on 22 of 30 nights with average 6 hrs 7 min.  Average AHI 3.9 with CPAP 14 cm H2O  Cardiac Tests:  Echo 02/09/19  >> EF 55 to 60%  Social History:  He  reports that he has been smoking cigarettes. He started smoking about 35 years ago. He has a 46.50 pack-year smoking history. He has quit using smokeless tobacco.  His smokeless tobacco use included snuff. He reports that he does not currently use alcohol. He reports that he does not use drugs.  Family History:  His family history is negative for sleep apnea.     Assessment/Plan:   Obstructive sleep apnea. - he reports compliance with CPAP and benefit from therapy - he uses [facility] for his DME - he was to get new auto CPAP with range 8 to 18 cm H2O; will have [facility] check on status of this order with his DME  Persistent daytime sleepiness. - will arrange for CPAP titration study followed by multiple sleep latency test - depending on results will determine if he needs adjust to his sleep apnea therapy, or if he might need addition of stimulant medication  Dyspnea on exertion. - will get copy of his cardiac testing from [facility] - will arrange for chest xray and pulmonary function test  Tobacco abuse. - he is willing to try bupropion again - discussed dosing schedule, setting quit  date, and side effects to monitor for - advised he can use nicotine patch while using bupropion  Obesity. - discussed how weight can impact sleep and risk for sleep disordered breathing - discussed options to assist with weight loss: combination of diet modification, cardiovascular and strength training exercises  Hypogonadotropic hypogonadism. - followed by Dr. [name] with [facility]   Time Spent Involved in Patient Care on Day of Examination:  42 minutes  Follow up:   Patient Instructions  Will have our patient care coordinator check on status of new auto CPAP machine from [facility]  Bupropion (wellbutrin) for smoking cessation.  Take 1 pill daily for 3 days, then 1 pill twice daily for next 4 days.  After using medicine for a week,  then stop smoking.  Stay on 1 pill twice per day after this.  You can use nicotine patches if needed to help with smoking cessation.  Will arrange for CPAP titration study followed by a multiple sleep latency test.  Will arrange for chest xray today  Will schedule pulmonary function test in [place] office and follow up there in 4 to 5 weeks  Medication List:   Allergies as of 10/06/2020   No Known Allergies      Medication List        Accurate as of October 06, 2020  9:48 AM. If you have any questions, ask your nurse or doctor.          buPROPion 150 MG 12 hr tablet Commonly known as: WELLBUTRIN SR Take 1 tablet (150 mg total) by mouth 2 (two) times daily. Started by: Chesley Mires, MD   pantoprazole 40 MG tablet Commonly known as: PROTONIX Take 1 tablet (40 mg total) by mouth daily.   SYRINGE 3CC/18GX1-1/2" 18G X 1-1/2" 3 ML Misc Use to draw testosterone   SYRINGE 3CC/20GX1" 20G X 1" 3 ML Misc Use to inject testosterone   B-D 3CC LUER-LOK SYR 22GX1" 22G X 1" 3 ML Misc Generic drug: SYRINGE-NEEDLE (DISP) 3 ML USE TO INJECT 0.25ML IM EVERY 1 WEEK.   testosterone cypionate 200 MG/ML injection Commonly known as: DEPOTESTOSTERONE CYPIONATE INJECT 0.6 mL INTO THE MUSCLE EVERY 7 DAYS FOR IM USE ONLY        Signature:  Chesley Mires, MD [place] Pager - [phone] 10/06/2020, 9:48 AM

## 2020-11-10 ENCOUNTER — Ambulatory Visit (INDEPENDENT_AMBULATORY_CARE_PROVIDER_SITE_OTHER): Payer: 59 | Admitting: Pulmonary Disease

## 2020-11-10 ENCOUNTER — Encounter: Payer: Self-pay | Admitting: Pulmonary Disease

## 2020-11-10 ENCOUNTER — Other Ambulatory Visit: Payer: Self-pay

## 2020-11-10 VITALS — BP 130/70 | HR 77 | Temp 97.5°F | Ht 71.0 in | Wt 267.0 lb

## 2020-11-10 DIAGNOSIS — G4733 Obstructive sleep apnea (adult) (pediatric): Secondary | ICD-10-CM

## 2020-11-10 DIAGNOSIS — G4719 Other hypersomnia: Secondary | ICD-10-CM | POA: Diagnosis not present

## 2020-11-10 DIAGNOSIS — R0609 Other forms of dyspnea: Secondary | ICD-10-CM

## 2020-11-10 DIAGNOSIS — Z9989 Dependence on other enabling machines and devices: Secondary | ICD-10-CM | POA: Diagnosis not present

## 2020-11-10 LAB — PULMONARY FUNCTION TEST
DL/VA % pred: 86 %
DL/VA: 3.83 ml/min/mmHg/L
DLCO cor % pred: 94 %
DLCO cor: 28.53 ml/min/mmHg
DLCO unc % pred: 94 %
DLCO unc: 28.53 ml/min/mmHg
FEF 25-75 Post: 4.35 L/sec
FEF 25-75 Pre: 4.36 L/sec
FEF2575-%Change-Post: 0 %
FEF2575-%Pred-Post: 122 %
FEF2575-%Pred-Pre: 122 %
FEV1-%Change-Post: 1 %
FEV1-%Pred-Post: 100 %
FEV1-%Pred-Pre: 99 %
FEV1-Post: 4.09 L
FEV1-Pre: 4.05 L
FEV1FVC-%Change-Post: 1 %
FEV1FVC-%Pred-Pre: 104 %
FEV6-%Change-Post: 0 %
FEV6-%Pred-Post: 98 %
FEV6-%Pred-Pre: 99 %
FEV6-Post: 4.96 L
FEV6-Pre: 5 L
FEV6FVC-%Change-Post: 0 %
FEV6FVC-%Pred-Post: 103 %
FEV6FVC-%Pred-Pre: 103 %
FVC-%Change-Post: 0 %
FVC-%Pred-Post: 94 %
FVC-%Pred-Pre: 95 %
FVC-Post: 4.96 L
FVC-Pre: 5 L
Post FEV1/FVC ratio: 83 %
Post FEV6/FVC ratio: 100 %
Pre FEV1/FVC ratio: 81 %
Pre FEV6/FVC Ratio: 100 %
RV % pred: 99 %
RV: 2.08 L
TLC % pred: 101 %
TLC: 7.25 L

## 2020-11-10 NOTE — Patient Instructions (Signed)
Follow up in 4 months in  office ?

## 2020-11-10 NOTE — Progress Notes (Signed)
Full PFT performed today. °

## 2020-11-10 NOTE — Progress Notes (Signed)
[facility]  Chief Complaint  Patient presents with   Follow-up    PFT review     Past Surgical History:  He  has a past surgical history that includes Hemorrhoid surgery.  Past Medical History:  RBBB, Cluster Headaches, ED, Depression, GERD, HTN, Low T, Nasal polyp, SBO  Constitutional:  BP 130/70 (BP Location: Left Arm, Cuff Size: Large)   Pulse 77   Temp (!) 97.5 F (36.4 C) (Oral)   Ht 5\' 11"  (1.803 m)   Wt 267 lb (121.1 kg)   SpO2 97%   BMI 37.24 kg/m   Brief Summary:  Devin Ray is a 50 y.o. male smoker with obstructive sleep apnea, daytime sleepiness, and dyspnea.       Subjective:   He got his new CPAP few weeks ago.  Feels this is working better.  PFT today normal.  Tolerating bupropion.  Down from 2 ppd to 1/2 ppd.    Only sleeping about 6 hours per night.  Has sleep studies set up for later this month.   Physical Exam:   Appearance - well kempt   ENMT - no sinus tenderness, no oral exudate, no LAN, Mallampati 3 airway, no stridor  Respiratory - equal breath sounds bilaterally, no wheezing or rales  CV - s1s2 regular rate and rhythm, no murmurs  Ext - no clubbing, no edema  Skin - no rashes  Psych - normal mood and affect     Pulmonary testing:  PFT 08/04/16 >> FEV1 4.08 (94%), FEV1% 80, TLC 7.20 (97%), DLCO 90% PFT 11/10/20 >> FEV1 4.09 (100%), FEV1% 83, TLC 7.25 (101%), DLCO 94%  Sleep Tests:  PSG 05/03/12 >> AHI 88.9, SpO2 low 74% CPAP 09/03/20 to 10/02/20 >> used on 22 of 30 nights with average 6 hrs 7 min.  Average AHI 3.9 with CPAP 14 cm H2O  Cardiac Tests:  Echo 02/09/19 >> EF 55 to 60%  Social History:  He  reports that he has been smoking cigarettes. He started smoking about 36 years ago. He has a 46.50 pack-year smoking history. He has quit using smokeless tobacco.  His smokeless tobacco use included snuff. He reports that he does not currently use alcohol. He reports that he does  not use drugs.  Family History:  His family history is negative for sleep apnea.     Assessment/Plan:   Obstructive sleep apnea. - he is compliant with CPAP and reports benefit from therapy - he uses [facility] for his DME - using auto CPAP 8 to 18 cm H2O - will get copy of report from his new machine  Persistent daytime sleepiness. - he has CPAP titration with MSLT scheduled for later this month - advised him to try getting 8 hours sleep per night  Tobacco abuse. - making progress with bupropion; continue for now - explained his PFT was not consistent with COPD  Obesity. - discussed how weight can impact sleep and risk for sleep disordered breathing - discussed options to assist with weight loss: combination of diet modification, cardiovascular and strength training exercises  Hypogonadotropic hypogonadism. - followed by Dr. [name] with [facility]   Time Spent Involved in Patient Care on Day of Examination:  32 minutes  Follow up:   Patient Instructions  Follow up in 4 months in [place] office  Medication List:   Allergies as of 11/10/2020   No Known Allergies      Medication List  Accurate as of November 10, 2020  4:40 PM. If you have any questions, ask your nurse or doctor.          buPROPion 150 MG 12 hr tablet Commonly known as: WELLBUTRIN SR Take 1 tablet (150 mg total) by mouth 2 (two) times daily.   pantoprazole 40 MG tablet Commonly known as: PROTONIX Take 1 tablet (40 mg total) by mouth daily.   SYRINGE 3CC/18GX1-1/2" 18G X 1-1/2" 3 ML Misc Use to draw testosterone   SYRINGE 3CC/20GX1" 20G X 1" 3 ML Misc Use to inject testosterone   B-D 3CC LUER-LOK SYR 22GX1" 22G X 1" 3 ML Misc Generic drug: SYRINGE-NEEDLE (DISP) 3 ML USE TO INJECT 0.25ML IM EVERY 1 WEEK.   testosterone cypionate 200 MG/ML injection Commonly known as: DEPOTESTOSTERONE CYPIONATE INJECT 0.6 mL INTO THE MUSCLE EVERY 7 DAYS FOR IM USE  ONLY        Signature:  Chesley Mires, MD [place] Pager - [phone] 11/10/2020, 4:40 PM

## 2020-11-10 NOTE — Patient Instructions (Signed)
Full PFT performed today. °

## 2020-11-23 ENCOUNTER — Ambulatory Visit (HOSPITAL_BASED_OUTPATIENT_CLINIC_OR_DEPARTMENT_OTHER): Payer: 59 | Attending: Pulmonary Disease | Admitting: Pulmonary Disease

## 2020-11-23 ENCOUNTER — Other Ambulatory Visit: Payer: Self-pay

## 2020-11-23 DIAGNOSIS — G4733 Obstructive sleep apnea (adult) (pediatric): Secondary | ICD-10-CM | POA: Diagnosis present

## 2020-11-23 DIAGNOSIS — G4719 Other hypersomnia: Secondary | ICD-10-CM

## 2020-11-24 ENCOUNTER — Encounter (HOSPITAL_BASED_OUTPATIENT_CLINIC_OR_DEPARTMENT_OTHER): Payer: 59 | Admitting: Pulmonary Disease

## 2020-12-03 ENCOUNTER — Telehealth: Payer: Self-pay | Admitting: Pulmonary Disease

## 2020-12-03 DIAGNOSIS — G4719 Other hypersomnia: Secondary | ICD-10-CM

## 2020-12-03 DIAGNOSIS — G4733 Obstructive sleep apnea (adult) (pediatric): Secondary | ICD-10-CM

## 2020-12-03 NOTE — Telephone Encounter (Signed)
Bipap titration 11/23/20 >> failed CPAP.  Changed to Bipap 17/13 cm H2O >> AHI 0.6.   Please let him know that he needed higher pressure settings than what he could tolerate with CPAP to control his sleep apnea.  He was switched to Bipap therapy during the titration study and did much better.  Please send an order to [facility] to have him switched to Bipap 17/13 cm H2O.

## 2020-12-03 NOTE — Procedures (Signed)
      Patient Name: [name], Devin Ray Date: 11/23/2020 Gender: Male D.O.B: 21-Apr-1970 Age (years): 75 Referring Provider: Chesley Mires MD, ABSM Height (inches): 71 Interpreting Physician: Chesley Mires MD, ABSM Weight (lbs): 262 RPSGT: Devin Ray BMI: 37 MRN: [id number] Neck Size: 18.00  CLINICAL INFORMATION The patient is referred for a BiPAP titration to treat sleep apnea.  He has been on CPAP therapy, but reports continued daytime sleepiness.  Date of NPSG 05/03/12:  AHI 88.9, SpO2 low 74%.  SLEEP Ray TECHNIQUE As per the AASM Manual for the Scoring of Sleep and Associated Events v2.3 (April 2016) with a hypopnea requiring 4% desaturations.  The channels recorded and monitored were frontal, central and occipital EEG, electrooculogram (EOG), submentalis EMG (chin), nasal and oral airflow, thoracic and abdominal wall motion, anterior tibialis EMG, snore microphone, electrocardiogram, and pulse oximetry. Bilevel positive airway pressure (BPAP) was initiated at the beginning of the Ray and titrated to treat sleep-disordered breathing.  MEDICATIONS Medications self-administered by patient taken the night of the Ray : N/A  RESPIRATORY PARAMETERS Optimal IPAP Pressure (cm): 17 AHI at Optimal Pressure (/hr) 0.6 Optimal EPAP Pressure (cm): 13   Overall Minimal O2 (%): 85.0 Minimal O2 at Optimal Pressure (%): 91.0  He was started on CPAP 12 and increased to 17 cm H2O.  He had difficulty tolerating CPAP at higher pressure settings.  He had mask fit adjusted, but continued to have difficulty using CPAP.  He was changed to Bipap with improved tolerance of therapy.  SLEEP ARCHITECTURE Start Time: 10:59:27 PM Stop Time: 4:58:20 AM Total Time (min): 358.9 Total Sleep Time (min): 341.6 Sleep Latency (min): 0.3 Sleep Efficiency (%): 95.2% REM Latency (min): 60.5 WASO (min): 17.0 Stage N1 (%): 4.7% Stage N2 (%): 70.3% Stage N3 (%): 0.0% Stage R (%): 25 Supine (%): 42.59 Arousal  Index (/hr): 9.0   CARDIAC DATA The 2 lead EKG demonstrated sinus rhythm. The mean heart rate was 67.0 beats per minute. Other EKG findings include: PVCs.  LEG MOVEMENT DATA The total Periodic Limb Movements of Sleep (PLMS) were 0. The PLMS index was 0.0. A PLMS index of <15 is considered normal in adults.  IMPRESSIONS - He was not able to tolerate CPAP at higher pressure settings needed to control his obstructive sleep apnea. - He did well with Biap 17/13 cm H2O. - He did not require the use of supplemental oxygen during this Ray.  DIAGNOSIS - Obstructive Sleep Apnea (G47.33)  RECOMMENDATIONS - Trial of BiPAP therapy on 17/13 cm H2O with a Medium size Resmed Full Face Mask AirFit F20 mask and heated humidification. - Avoid alcohol, sedatives and other CNS depressants that may worsen sleep apnea and disrupt normal sleep architecture. - Sleep hygiene should be reviewed to assess factors that may improve sleep quality. - Weight management and regular exercise should be initiated or continued.  [Electronically signed] 12/03/2020 09:06 AM  Chesley Mires MD, ABSM Diplomate, American Board of Sleep Medicine   NPI: [id number] [place] PH: [phone]   FX: [phone] [place]

## 2020-12-05 NOTE — Telephone Encounter (Signed)
He only needed to do MSLT if his titration study was normal.  Since he needed change to his sleep apnea therapy, there wasn't a needed to do MSLT.

## 2020-12-05 NOTE — Telephone Encounter (Signed)
Called and spoke with patient to let him know results of titration study. He expressed understanding and confirmed that he still uses [facility] in [place]. Order has been placed for him to switch to Bipap.  Dr. Halford Chessman patient states that he was let go from study at 5 am and then got a notification that he missed an appointment with you at 7 am. Looking at his appointment desk it says MSLT for 7am that following morning. Is this something that he needs?  Please advise

## 2020-12-05 NOTE — Telephone Encounter (Signed)
Noted. Thank you. Patient notified. Nothing further needed at this time.

## 2020-12-31 ENCOUNTER — Telehealth: Payer: Self-pay | Admitting: Pulmonary Disease

## 2021-01-01 NOTE — Telephone Encounter (Signed)
[facility] and spoke with Shanon Brow about call from pt.  Stated to Shanon Brow that we did fax the order for this 12/2.  Per [place], they do not keep BIPAPs at the pharmacy as they only have CPAPs there but they can order one for him.  Shanon Brow said that he could not find the order that had been faxed to them and stated that they will need to have this refaxed and said as soon as they did receive that, they could get a machine ordered for pt.  Called and spoke with pt letting him know all this info and stated to him that we are going to refax the Rx and he verbalized understanding. Rx has been refaxed for pt's BIPAP. Nothing further needed.

## 2021-03-19 ENCOUNTER — Ambulatory Visit: Payer: 59 | Admitting: Endocrinology

## 2021-04-06 ENCOUNTER — Encounter: Payer: Self-pay | Admitting: Endocrinology

## 2021-04-06 ENCOUNTER — Ambulatory Visit (INDEPENDENT_AMBULATORY_CARE_PROVIDER_SITE_OTHER): Payer: 59 | Admitting: Endocrinology

## 2021-04-06 VITALS — BP 138/90 | HR 83 | Ht 71.0 in | Wt 262.2 lb

## 2021-04-06 DIAGNOSIS — E349 Endocrine disorder, unspecified: Secondary | ICD-10-CM

## 2021-04-06 NOTE — Progress Notes (Signed)
Patient ID: Devin Ray, male   DOB: [birth year], 51 y.o.   MRN: [id number] ? ?      ? ? ? ? ? ? ?Chief complaint: Fatigue, follow-up of low testosterone ? ?History of Present Illness ? ? ?Hypogonadism was diagnosed in 2019 ? ?Background history: ?He has had since about 2016 complaints of fatigue, decreased motivation, decreased libido ?The symptoms started relatively quickly at the onset ?He was apparently also having issues with sleep apnea in which he was started on treatment ?Testosterone level in 2018 was over 300 ?Subsequently his testosterone levels were low and was evaluated by endocrinologist ?There is no history of the following: Hot flushes or sweating, no history of long term anabolic steroid use, history of testicular injury or known mumps in childhood.  ? ?Baseline evaluation including high normal LH, normal prolactin, free testosterone level not available ? ?He was started on testosterone injections, not clear why he did not try other forms of testosterone supplementation initially ?Although he did feel better with starting injections he did not completely feel back to normal with his energy level or libido ?Also not clear why he stopped his treatment about 2 months later and did not go back for follow-up ? ?He started back on testosterone injections, 50 mg every 2 weeks again and 5/21 from his PCP and was taking the injection weekly as he felt better with doing it weekly compared to every 2 weeks. ?Testosterone level done 2 days after his injection in July was 394 ? ?RECENT HISTORY: ?He was first seen in consultation in 9/21 with complaints of  fatigue, lethargy and somnolence and decreased libido ?He had not taken injections for about a month and his afternoon testosterone was 234 although free testosterone was normal at 9.9 ? ?With starting on 50 mg weekly of Depo testosterone he did have some improvement in his energy level and fatigue ? ?With his treatment is free testosterone was improved at 11.8  and he was continued on the same dose ? ?He is also being treated for sleep apnea and may feel a little tired on waking up ?Again his energy level fluctuates from day-to-day and some days he feels better with improved energy ?With adjusting his CPAP in the last few months he has overall felt better ?Also has decreased libido ? ?He feels like he has difficulty losing weight although this does fluctuate ?More recently has started doing a little more physical activity and is planning to do some weights for exercise  ? ?His last injection was yesterday and takes more regularly now on Sundays ?Testosterone level pending ? ?Wt Readings from Last 3 Encounters:  ?04/06/21 262 lb 3.2 oz (118.9 kg)  ?11/23/20 262 lb (118.8 kg)  ?11/10/20 267 lb (121.1 kg)  ? ? ?Labs: ? ?LOWEST testosterone level 160, done on 01/12/2019 at 10 AM ? ?Lab Results  ?Component Value Date  ? TESTOSTERONE 390 09/18/2020  ? TESTOSTERONE 296 03/13/2020  ? TESTOSTERONE 319 11/05/2019  ? TESTOSTERONE 234 (L) 09/17/2019  ? ? ?Prolactin level: 6.7 done in 2019 ? ?No results found for: LH ? ?LH was 7.2 done on 12/10/2017, previously 4.2 ?         ?Lab Results  ?Component Value Date  ? HGB 15.0 03/13/2020  ? ? ?  ?Allergies as of 04/06/2021   ?No Known Allergies ?  ? ?  ?Medication List  ?  ? ?  ? Accurate as of April 06, 2021  3:41 PM. If you have any  questions, ask your nurse or doctor.  ?  ?  ? ?  ? ?buPROPion 150 MG 12 hr tablet ?Commonly known as: WELLBUTRIN SR ?Take 1 tablet (150 mg total) by mouth 2 (two) times daily. ?  ?pantoprazole 40 MG tablet ?Commonly known as: PROTONIX ?Take 1 tablet (40 mg total) by mouth daily. ?  ?SYRINGE 3CC/18GX1-1/2" 18G X 1-1/2" 3 ML Misc ?Use to draw testosterone ?  ?SYRINGE 3CC/20GX1" 20G X 1" 3 ML Misc ?Use to inject testosterone ?  ?B-D 3CC LUER-LOK SYR 22GX1" 22G X 1" 3 ML Misc ?Generic drug: SYRINGE-NEEDLE (DISP) 3 ML ?USE TO INJECT 0.25ML IM EVERY 1 WEEK. ?  ?testosterone cypionate 200 MG/ML injection ?Commonly known  as: DEPOTESTOSTERONE CYPIONATE ?INJECT 0.6 mL INTO THE MUSCLE EVERY 7 DAYS FOR IM USE ONLY ?  ? ?  ? ? ?Allergies: No Known Allergies ? ?Past Medical History:  ?Diagnosis Date  ? Cluster headache   ? Depression   ? ED (erectile dysfunction)   ? GERD (gastroesophageal reflux disease)   ? Hypertension   ? Insomnia   ? Low testosterone   ? Nasal polyp   ? SBO (small bowel obstruction) ([place]) 10/12/2017  ? Sleep apnea   ? ? ?Past Surgical History:  ?Procedure Laterality Date  ? HEMORRHOID SURGERY    ? ? ?No family history on file. ? ?Social History:  reports that he has been smoking cigarettes. He started smoking about 36 years ago. He has a 46.50 pack-year smoking history. He has quit using smokeless tobacco.  His smokeless tobacco use included snuff. He reports that he does not currently use alcohol. He reports that he does not use drugs. ? ?Review of Systems ?  ?Sleep apnea: ? ?Symptoms are waking up feeling tired ?Starting BiPAP he is overall doing better and not going to bed earlier in the evening ? ?No diabetes, last glucose in the office normal ? ?Has normal TSH and free T4 level: ?Last TSH in 7/22 by PCP ? ?Lab Results  ?Component Value Date  ? TSH 1.880 07/19/2017  ? FREET4 0.91 09/17/2019  ? FREET4 1.34 07/19/2017  ? ?Vitamin B12 level low normal on screening ? ? ? ?General Examination:  ? ?BP 138/90   Pulse 83   Ht '5\' 11"'$  (1.803 m)   Wt 262 lb 3.2 oz (118.9 kg)   SpO2 98%   BMI 36.57 kg/m?  ? ? ? ?Assessment/ Plan: ? 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 ?He has had times low testosterone levels as low as 160 at baseline although inconsistent  ?Prior to treatment the free testosterone was only low normal but he was likely having symptoms ? ?He has had some improvement in his fatigue with testosterone injections ?Currently taking 50 mg injection every Sunday and has no difficulty doing the injections ?A lot of his fatigue was also related to sleep apnea although this is  better controlled now ? ?Free testosterone level has been between 9-11 last year and follow-up labs is pending from today ? ?Follow-up to be decided based on his free testosterone level ?Also need to periodically monitor his hematocrit ? ?[name] ?04/06/2021, 3:41 PM  ? ? ? ? ?Note: This office note was prepared with Estate agent. Any transcriptional errors that result from this process are unintentional. ?

## 2021-04-07 LAB — CBC
HCT: 42.9 % (ref 39.0–52.0)
Hemoglobin: 14.4 g/dL (ref 13.0–17.0)
MCHC: 33.6 g/dL (ref 30.0–36.0)
MCV: 94.2 fl (ref 78.0–100.0)
Platelets: 205 10*3/uL (ref 150.0–400.0)
RBC: 4.56 Mil/uL (ref 4.22–5.81)
RDW: 14.4 % (ref 11.5–15.5)
WBC: 6.7 10*3/uL (ref 4.0–10.5)

## 2021-04-09 LAB — TESTOSTERONE, FREE, TOTAL, SHBG
Sex Hormone Binding: 26.1 nmol/L (ref 19.3–76.4)
Testosterone, Free: 8.2 pg/mL (ref 7.2–24.0)
Testosterone: 209 ng/dL — ABNORMAL LOW (ref 264–916)

## 2021-04-14 NOTE — Progress Notes (Signed)
The active testosterone level is slightly lower than usual, to increase testosterone dose to 0.6 mL instead of 0.5 mL at a time

## 2021-08-10 ENCOUNTER — Other Ambulatory Visit: Payer: Self-pay | Admitting: Endocrinology

## 2021-09-15 ENCOUNTER — Other Ambulatory Visit: Payer: Self-pay | Admitting: "Endocrinology

## 2021-09-15 ENCOUNTER — Other Ambulatory Visit: Payer: Self-pay | Admitting: Endocrinology

## 2021-10-08 ENCOUNTER — Ambulatory Visit: Payer: 59 | Admitting: Endocrinology

## 2021-10-27 ENCOUNTER — Encounter (INDEPENDENT_AMBULATORY_CARE_PROVIDER_SITE_OTHER): Payer: Self-pay | Admitting: *Deleted

## 2021-11-14 ENCOUNTER — Encounter (INDEPENDENT_AMBULATORY_CARE_PROVIDER_SITE_OTHER): Payer: Self-pay | Admitting: Gastroenterology

## 2021-11-17 ENCOUNTER — Telehealth: Payer: Self-pay | Admitting: *Deleted

## 2021-11-17 NOTE — Telephone Encounter (Signed)
Any room

## 2021-11-17 NOTE — Telephone Encounter (Signed)
Referring MD/PCP: Dr. Woody Seller  Procedure: Colonoscopy  Reason/Indication:  screening  Has patient had this procedure before?  no  If so, when, by whom and where?    Is there a family history of colon cancer?  Yes, brother  Who?  What age when diagnosed?    Is patient diabetic? If yes, Type 1 or Type 2   no      Does patient have prosthetic heart valve or mechanical valve?  no  Do you have a pacemaker/defibrillator?  no  Has patient ever had endocarditis/atrial fibrillation? no  Does patient use oxygen? no  Has patient had joint replacement within last 12 months?  no  Is patient constipated or do they take laxatives? no  Does patient have a history of alcohol/drug use?  no  Have you had a stroke/heart attack last 6 mths? no  Do you take medicine for weight loss?  no  Is patient on blood thinner such as Coumadin, Plavix and/or Aspirin? no  Medications:  Current Outpatient Medications on File Prior to Visit  Medication Sig Dispense Refill   testosterone cypionate (DEPOTESTOSTERONE CYPIONATE) 200 MG/ML injection INJECT 0.6 ML INTO THE MUSCLE EVERY 7 DAYS 4 mL 2   No current facility-administered medications on file prior to visit.     Allergies: No Known Allergies

## 2021-11-18 ENCOUNTER — Encounter (INDEPENDENT_AMBULATORY_CARE_PROVIDER_SITE_OTHER): Payer: Self-pay | Admitting: *Deleted

## 2021-11-18 MED ORDER — PEG 3350-KCL-NA BICARB-NACL 420 G PO SOLR: 4000.0000 mL | Freq: Once | ORAL | 0 refills | Status: AC

## 2021-11-18 NOTE — Telephone Encounter (Signed)
Called pt. He is going to look at work schedule and call me back to schedule

## 2021-11-18 NOTE — Telephone Encounter (Signed)
PA done via [facility]. NO PA REQUIRED.  Decision ID #:[id number]

## 2021-11-18 NOTE — Telephone Encounter (Signed)
Pt called back. Scheduled for 12/13 at 2pm. Aware will send prep instructions. Rx for prep sent to pharmacy

## 2021-11-18 NOTE — Addendum Note (Signed)
Addended by: Cheron Every on: 11/18/2021 11:12 AM   Modules accepted: Orders

## 2021-12-16 ENCOUNTER — Encounter (HOSPITAL_COMMUNITY): Payer: Self-pay | Admitting: Gastroenterology

## 2021-12-16 ENCOUNTER — Ambulatory Visit (HOSPITAL_COMMUNITY): Payer: 59 | Admitting: Certified Registered"

## 2021-12-16 ENCOUNTER — Encounter (HOSPITAL_COMMUNITY)
Admission: RE | Disposition: A | Discharge status: Home / Self Care | Payer: Self-pay | Source: Home / Self Care | Attending: Gastroenterology

## 2021-12-16 ENCOUNTER — Other Ambulatory Visit: Payer: Self-pay

## 2021-12-16 ENCOUNTER — Ambulatory Visit (HOSPITAL_BASED_OUTPATIENT_CLINIC_OR_DEPARTMENT_OTHER): Payer: 59 | Admitting: Certified Registered"

## 2021-12-16 ENCOUNTER — Ambulatory Visit (HOSPITAL_COMMUNITY)
Admission: RE | Admit: 2021-12-16 | Discharge: 2021-12-16 | Disposition: A | Discharge status: Home / Self Care | Payer: 59 | Attending: Gastroenterology | Admitting: Gastroenterology

## 2021-12-16 DIAGNOSIS — K635 Polyp of colon: Secondary | ICD-10-CM | POA: Insufficient documentation

## 2021-12-16 DIAGNOSIS — G4730 Sleep apnea, unspecified: Secondary | ICD-10-CM | POA: Insufficient documentation

## 2021-12-16 DIAGNOSIS — D123 Benign neoplasm of transverse colon: Secondary | ICD-10-CM | POA: Diagnosis not present

## 2021-12-16 DIAGNOSIS — Z800 Family history of malignant neoplasm of digestive organs: Secondary | ICD-10-CM

## 2021-12-16 DIAGNOSIS — F17210 Nicotine dependence, cigarettes, uncomplicated: Secondary | ICD-10-CM | POA: Diagnosis not present

## 2021-12-16 DIAGNOSIS — D124 Benign neoplasm of descending colon: Secondary | ICD-10-CM | POA: Diagnosis not present

## 2021-12-16 DIAGNOSIS — K5730 Diverticulosis of large intestine without perforation or abscess without bleeding: Secondary | ICD-10-CM | POA: Diagnosis not present

## 2021-12-16 DIAGNOSIS — K648 Other hemorrhoids: Secondary | ICD-10-CM | POA: Diagnosis not present

## 2021-12-16 DIAGNOSIS — I10 Essential (primary) hypertension: Secondary | ICD-10-CM | POA: Insufficient documentation

## 2021-12-16 DIAGNOSIS — Z1211 Encounter for screening for malignant neoplasm of colon: Secondary | ICD-10-CM | POA: Diagnosis present

## 2021-12-16 DIAGNOSIS — D122 Benign neoplasm of ascending colon: Secondary | ICD-10-CM | POA: Insufficient documentation

## 2021-12-16 HISTORY — PX: COLONOSCOPY WITH PROPOFOL: SHX5780

## 2021-12-16 HISTORY — PX: POLYPECTOMY: SHX149

## 2021-12-16 LAB — HM COLONOSCOPY

## 2021-12-16 SURGERY — COLONOSCOPY WITH PROPOFOL
Anesthesia: General

## 2021-12-16 MED ORDER — LACTATED RINGERS IV SOLN: INTRAVENOUS | Status: DC | PRN

## 2021-12-16 MED ORDER — LACTATED RINGERS IV SOLN: INTRAVENOUS | Status: DC

## 2021-12-16 MED ORDER — PROPOFOL 500 MG/50ML IV EMUL
INTRAVENOUS | Status: DC | PRN
  Administered 2021-12-16: 150 ug/kg/min via INTRAVENOUS

## 2021-12-16 MED ORDER — PROPOFOL 10 MG/ML IV BOLUS
INTRAVENOUS | Status: DC | PRN
  Administered 2021-12-16 (×2): 10 mg via INTRAVENOUS
  Administered 2021-12-16: 90 mg via INTRAVENOUS
  Administered 2021-12-16 (×4): 10 mg via INTRAVENOUS

## 2021-12-16 NOTE — Anesthesia Postprocedure Evaluation (Signed)
Anesthesia Post Note  Patient: Devin Ray  Procedure(s) Performed: COLONOSCOPY WITH PROPOFOL POLYPECTOMY INTESTINAL  Patient location during evaluation: Endoscopy Anesthesia Type: General Level of consciousness: awake and alert Pain management: pain level controlled Vital Signs Assessment: post-procedure vital signs reviewed and stable Respiratory status: spontaneous breathing, nonlabored ventilation, respiratory function stable and patient connected to nasal cannula oxygen Cardiovascular status: blood pressure returned to baseline and stable Postop Assessment: no apparent nausea or vomiting Anesthetic complications: no   There were no known notable events for this encounter.   Last Vitals:  Vitals:   12/16/21 1143 12/16/21 1248  BP: 136/87 126/75  Pulse: 94 99  Resp: 16 (!) 21  Temp: 36.8 C 36.8 C  SpO2: 96% 95%    Last Pain:  Vitals:   12/16/21 1248  TempSrc: Axillary  PainSc: 0-No pain                 Trixie Rude

## 2021-12-16 NOTE — Op Note (Signed)
[facility] Patient Name: Devin Ray Procedure Date: 12/16/2021 11:21 AM MRN: [id number] Date of Birth: Aug 28, 1970 Attending MD: Maylon Peppers , , [id number] CSN: [id number] Age: 51 Admit Type: Outpatient Procedure:                Colonoscopy Indications:              Screening in patient at increased risk: Family                            history of 1st-degree relative with colorectal                            cancer before age 56 years Providers:                Maylon Peppers, [place] Page, Thomas Hoff., Technician Referring MD:              Medicines:                Monitored Anesthesia Care Complications:            No immediate complications. Estimated Blood Loss:     Estimated blood loss: none. Procedure:                Pre-Anesthesia Assessment:                           - Prior to the procedure, a History and Physical                            was performed, and patient medications, allergies                            and sensitivities were reviewed. The patient's                            tolerance of previous anesthesia was reviewed.                           - The risks and benefits of the procedure and the                            sedation options and risks were discussed with the                            patient. All questions were answered and informed                            consent was obtained.                           - ASA Grade Assessment: II - A patient with mild                            systemic disease.  After obtaining informed consent, the colonoscope                            was passed under direct vision. Throughout the                            procedure, the patient's blood pressure, pulse, and                            oxygen saturations were monitored continuously. The                            [id number]-HQ190L ([id number]) scope was introduced through                             the anus and advanced to the the cecum, identified                            by appendiceal orifice and ileocecal valve. The                            colonoscopy was performed without difficulty. The                            patient tolerated the procedure well. The quality                            of the bowel preparation was excellent. Scope In: 12:20:07 PM Scope Out: 12:43:22 PM Scope Withdrawal Time: 0 hours 15 minutes 39 seconds  Total Procedure Duration: 0 hours 23 minutes 15 seconds  Findings:      The perianal and digital rectal examinations were normal.      Two sessile polyps were found in the transverse colon and ascending       colon. The polyps were 4 to 8 mm in size. These polyps were removed with       a cold snare. Resection and retrieval were complete.      A 4 mm polyp was found in the descending colon. The polyp was sessile.       The polyp was removed with a cold snare. Resection and retrieval were       complete.      Multiple large-mouthed and small-mouthed diverticula were found in the       sigmoid colon and descending colon.      The retroflexed view of the distal rectum and anal verge was normal and       showed no anal or rectal abnormalities. Impression:               - Two 4 to 8 mm polyps in the transverse colon and                            in the ascending colon, removed with a cold snare.                            Resected and retrieved.                           -  One 4 mm polyp in the descending colon, removed                            with a cold snare. Resected and retrieved.                           - Diverticulosis in the sigmoid colon and in the                            descending colon.                           - The distal rectum and anal verge are normal on                            retroflexion view. Moderate Sedation:      Per Anesthesia Care Recommendation:           - Discharge patient to home (ambulatory).                            - Resume previous diet.                           - Await pathology results.                           - Repeat colonoscopy in 3 years for surveillance. Procedure Code(s):        --- Professional ---                           [phone], Colonoscopy, flexible; with removal of                            tumor(s), polyp(s), or other lesion(s) by snare                            technique Diagnosis Code(s):        --- Professional ---                           Z80.0, Family history of malignant neoplasm of                            digestive organs                           D12.3, Benign neoplasm of transverse colon (hepatic                            flexure or splenic flexure)                           D12.2, Benign neoplasm of ascending colon                           D12.4, Benign neoplasm of  descending colon                           K57.30, Diverticulosis of large intestine without                            perforation or abscess without bleeding CPT copyright 2022 American Medical Association. All rights reserved. The codes documented in this report are preliminary and upon coder review may  be revised to meet current compliance requirements. Maylon Peppers, MD Maylon Peppers,  12/16/2021 12:48:48 PM This report has been signed electronically. Number of Addenda: 0

## 2021-12-16 NOTE — Anesthesia Preprocedure Evaluation (Signed)
Anesthesia Evaluation  Patient identified by MRN, date of birth, ID band Patient awake    Reviewed: Allergy & Precautions, NPO status , Patient's Chart, lab work & pertinent test results  Airway Mallampati: II  TM Distance: >3 FB Neck ROM: Full    Dental no notable dental hx.    Pulmonary sleep apnea , Current Smoker and Patient abstained from smoking.   Pulmonary exam normal        Cardiovascular Exercise Tolerance: Good METS: 7 - 9 Mets hypertension, negative cardio ROS Normal cardiovascular exam     Neuro/Psych  Headaches PSYCHIATRIC DISORDERS Anxiety Depression       GI/Hepatic Neg liver ROS,GERD  ,,  Endo/Other  negative endocrine ROS    Renal/GU negative Renal ROS     Musculoskeletal negative musculoskeletal ROS (+)    Abdominal  (+) + obese  Peds  Hematology negative hematology ROS (+)   Anesthesia Other Findings   Reproductive/Obstetrics                             Anesthesia Physical Anesthesia Plan  ASA: 2  Anesthesia Plan: General   Post-op Pain Management: Minimal or no pain anticipated   Induction: Intravenous  PONV Risk Score and Plan: 1 and TIVA  Airway Management Planned: Nasal Cannula and Natural Airway  Additional Equipment:   Intra-op Plan:   Post-operative Plan:   Informed Consent: I have reviewed the patients History and Physical, chart, labs and discussed the procedure including the risks, benefits and alternatives for the proposed anesthesia with the patient or authorized representative who has indicated his/her understanding and acceptance.       Plan Discussed with: CRNA  Anesthesia Plan Comments:        Anesthesia Quick Evaluation

## 2021-12-16 NOTE — H&P (Signed)
Mali NATAN HARTOG is an 51 y.o. male.   Chief Complaint: Colorectal cancer screening family history of colon cancer HPI: 51 year old male with past medical history of depression, GERD, hypertension, sleep apnea, coming for screening colonoscopy. The patient has never had a colonoscopy in the past.  The patient denies having any complaints such as melena, hematochezia, abdominal pain or distention, change in her bowel movement consistency or frequency, no changes in weight recently.  States his brother was diagnosed at age 20 with colon cancer.   Past Medical History:  Diagnosis Date   Cluster headache    Depression    ED (erectile dysfunction)    GERD (gastroesophageal reflux disease)    Hypertension    Insomnia    Low testosterone    Nasal polyp    SBO (small bowel obstruction) ([place]) 10/12/2017   Sleep apnea     Past Surgical History:  Procedure Laterality Date   HEMORRHOID SURGERY      History reviewed. No pertinent family history. Social History:  reports that he has been smoking cigarettes. He started smoking about 37 years ago. He has a 46.50 pack-year smoking history. He has quit using smokeless tobacco.  His smokeless tobacco use included snuff. He reports that he does not currently use alcohol. He reports that he does not use drugs.  Allergies: No Known Allergies  Medications Prior to Admission  Medication Sig Dispense Refill   testosterone cypionate (DEPOTESTOSTERONE CYPIONATE) 200 MG/ML injection INJECT 0.6 ML INTO THE MUSCLE EVERY 7 DAYS 4 mL 2    No results found for this or any previous visit (from the past 48 hour(s)). No results found.  Review of Systems  All other systems reviewed and are negative.   Blood pressure 136/87, pulse 94, temperature 98.3 F (36.8 C), temperature source Oral, resp. rate 16, SpO2 96 %. Physical Exam  GENERAL: The patient is AO x3, in no acute distress. HEENT: Head is normocephalic and atraumatic. EOMI are intact. Mouth is well hydrated  and without lesions. NECK: Supple. No masses LUNGS: Clear to auscultation. No presence of rhonchi/wheezing/rales. Adequate chest expansion HEART: RRR, normal s1 and s2. ABDOMEN: Soft, nontender, no guarding, no peritoneal signs, and nondistended. BS +. No masses. EXTREMITIES: Without any cyanosis, clubbing, rash, lesions or edema. NEUROLOGIC: AOx3, no focal motor deficit. SKIN: no jaundice, no rashes  Assessment/Plan 51 year old male with past medical history of depression, GERD, hypertension, sleep apnea, coming for screening colonoscopy and family history of colon cancer.  Will proceed with colonoscopy  Harvel Quale, MD 12/16/2021, 12:01 PM

## 2021-12-16 NOTE — Transfer of Care (Signed)
Immediate Anesthesia Transfer of Care Note  Patient: Devin Ray  Procedure(s) Performed: COLONOSCOPY WITH PROPOFOL POLYPECTOMY INTESTINAL  Patient Location: PACU  Anesthesia Type:General  Level of Consciousness: awake, alert , oriented, and patient cooperative  Airway & Oxygen Therapy: Patient Spontanous Breathing  Post-op Assessment: Report given to RN and Post -op Vital signs reviewed and stable  Post vital signs: Reviewed and stable  Last Vitals:  Vitals Value Taken Time  BP 126/75 12/16/21 1248  Temp 36.8 C 12/16/21 1248  Pulse 99 12/16/21 1248  Resp 21 12/16/21 1248  SpO2 95 % 12/16/21 1248    Last Pain:  Vitals:   12/16/21 1248  TempSrc: Axillary  PainSc: 0-No pain         Complications: No notable events documented.

## 2021-12-16 NOTE — Discharge Instructions (Addendum)
You are being discharged to home.  Resume your previous diet.  We are waiting for your pathology results.  Your physician has recommended a repeat colonoscopy in three years for surveillance.  

## 2021-12-17 ENCOUNTER — Encounter (INDEPENDENT_AMBULATORY_CARE_PROVIDER_SITE_OTHER): Payer: Self-pay | Admitting: *Deleted

## 2021-12-17 LAB — SURGICAL PATHOLOGY

## 2021-12-23 ENCOUNTER — Encounter (HOSPITAL_COMMUNITY): Payer: Self-pay | Admitting: Gastroenterology

## 2022-03-02 NOTE — Progress Notes (Unsigned)
Patient ID: Devin Ray, male   DOB: 13-Aug-1970, 52 y.o.   MRN: [id number]              Chief complaint: Fatigue, follow-up of low testosterone  History of Present Illness   Hypogonadism was diagnosed in 2019  Background history: He has had since about 2016 complaints of fatigue, decreased motivation, decreased libido The symptoms started relatively quickly at the onset He was apparently also having issues with sleep apnea in which he was started on treatment Testosterone level in 2018 was over 300 Subsequently his testosterone levels were low and was evaluated by endocrinologist There is no history of the following: Hot flushes or sweating, no history of long term anabolic steroid use, history of testicular injury or known mumps in childhood.   Baseline evaluation including high normal LH, normal prolactin, free testosterone level not available  He was started on testosterone injections, not clear why he did not try other forms of testosterone supplementation initially Although he did feel better with starting injections he did not completely feel back to normal with his energy level or libido Also not clear why he stopped his treatment about 2 months later and did not go back for follow-up  He started back on testosterone injections, 50 mg every 2 weeks again and 5/21 from his PCP and was taking the injection weekly as he felt better with doing it weekly compared to every 2 weeks. Testosterone level done 2 days after his injection in July was 394  RECENT HISTORY: He was first seen in consultation in 9/21 with complaints of  fatigue, lethargy and somnolence and decreased libido He had not taken injections for about a month and his afternoon testosterone was 234 although free testosterone was normal at 9.9  With starting on 50 mg weekly of Depo testosterone he did have some improvement in his energy level and fatigue  With his treatment is free testosterone was improved at 11.8  and he was continued on the same dose  He is also being treated for sleep apnea and may feel a little tired on waking up Again his energy level fluctuates from day-to-day and some days he feels better with improved energy With adjusting his CPAP in the last few months he has overall felt better Also has decreased libido  He feels like he has difficulty losing weight although this does fluctuate More recently has started doing a little more physical activity and is planning to do some weights for exercise   His last injection was yesterday and takes more regularly now on Sundays Testosterone level pending  Wt Readings from Last 3 Encounters:  12/16/21 262 lb (118.8 kg)  04/06/21 262 lb 3.2 oz (118.9 kg)  11/23/20 262 lb (118.8 kg)    Labs:  LOWEST testosterone level 160, done on 01/12/2019 at 10 AM  Lab Results  Component Value Date   TESTOSTERONE 209 (L) 04/06/2021   TESTOSTERONE 390 09/18/2020   TESTOSTERONE 296 03/13/2020   TESTOSTERONE 319 11/05/2019    Prolactin level: 6.7 done in 2019  No results found for: "LH"  LH was 7.2 done on 12/10/2017, previously 4.2          Lab Results  Component Value Date   HGB 14.4 04/06/2021      Allergies as of 03/03/2022   No Known Allergies      Medication List        Accurate as of March 02, 2022  4:57 PM. If you have any  questions, ask your nurse or doctor.          testosterone cypionate 200 MG/ML injection Commonly known as: DEPOTESTOSTERONE CYPIONATE INJECT 0.6 ML INTO THE MUSCLE EVERY 7 DAYS        Allergies: No Known Allergies  Past Medical History:  Diagnosis Date   Cluster headache    Depression    ED (erectile dysfunction)    GERD (gastroesophageal reflux disease)    Hypertension    Insomnia    Low testosterone    Nasal polyp    SBO (small bowel obstruction) ([place]) 10/12/2017   Sleep apnea     Past Surgical History:  Procedure Laterality Date   COLONOSCOPY WITH PROPOFOL N/A 12/16/2021    Procedure: COLONOSCOPY WITH PROPOFOL;  Surgeon: Harvel Quale, MD;  [facility];  Service: Gastroenterology;  Laterality: N/A;  2:00pm, asa 1-2   HEMORRHOID SURGERY     POLYPECTOMY  12/16/2021   Procedure: POLYPECTOMY INTESTINAL;  Surgeon: Harvel Quale, MD;  Location: [facility];  Service: Gastroenterology;;    No family history on file.  Social History:  reports that he has been smoking cigarettes. He started smoking about 37 years ago. He has a 46.50 pack-year smoking history. He has quit using smokeless tobacco.  His smokeless tobacco use included snuff. He reports that he does not currently use alcohol. He reports that he does not use drugs.  Review of Systems   Sleep apnea:  Symptoms are waking up feeling tired Starting BiPAP he is overall doing better and not going to bed earlier in the evening  No diabetes, last glucose in the office normal  Has normal TSH and free T4 level: Last TSH in 7/22 by PCP  Lab Results  Component Value Date   TSH 1.880 07/19/2017   FREET4 0.91 09/17/2019   FREET4 1.34 07/19/2017   Vitamin B12 level low normal on screening    General Examination:   There were no vitals taken for this visit.    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 and symptomatic  He has had times low testosterone levels as low as 160 at baseline although inconsistent  Prior to treatment the free testosterone was only low normal but he was likely having symptoms  He has had some improvement in his fatigue with testosterone injections Currently taking 50 mg injection every Sunday and has no difficulty doing the injections A lot of his fatigue was also related to sleep apnea although this is better controlled now  Free testosterone level has been between 9-11 last year and follow-up labs is pending from today  Follow-up to be decided based on his free testosterone level Also need to periodically  monitor his hematocrit  [name] 03/02/2022, 4:57 PM      Note: This office note was prepared with Dragon voice recognition system technology. Any transcriptional errors that result from this process are unintentional.

## 2022-03-03 ENCOUNTER — Encounter: Payer: Self-pay | Admitting: Endocrinology

## 2022-03-03 ENCOUNTER — Ambulatory Visit (INDEPENDENT_AMBULATORY_CARE_PROVIDER_SITE_OTHER): Payer: 59 | Admitting: Endocrinology

## 2022-03-03 VITALS — BP 118/82 | HR 70 | Ht 71.0 in | Wt 269.8 lb

## 2022-03-03 DIAGNOSIS — E349 Endocrine disorder, unspecified: Secondary | ICD-10-CM | POA: Diagnosis not present

## 2022-03-03 DIAGNOSIS — G4730 Sleep apnea, unspecified: Secondary | ICD-10-CM

## 2022-03-03 DIAGNOSIS — R5382 Chronic fatigue, unspecified: Secondary | ICD-10-CM | POA: Diagnosis not present

## 2022-03-03 DIAGNOSIS — R635 Abnormal weight gain: Secondary | ICD-10-CM | POA: Diagnosis not present

## 2022-03-03 LAB — CBC WITH DIFFERENTIAL/PLATELET
Basophils Absolute: 0 10*3/uL (ref 0.0–0.1)
Basophils Relative: 0.5 % (ref 0.0–3.0)
Eosinophils Absolute: 0.1 10*3/uL (ref 0.0–0.7)
Eosinophils Relative: 2 % (ref 0.0–5.0)
HCT: 44.1 % (ref 39.0–52.0)
Hemoglobin: 14.8 g/dL (ref 13.0–17.0)
Lymphocytes Relative: 25.8 % (ref 12.0–46.0)
Lymphs Abs: 1.9 10*3/uL (ref 0.7–4.0)
MCHC: 33.6 g/dL (ref 30.0–36.0)
MCV: 96 fl (ref 78.0–100.0)
Monocytes Absolute: 0.8 10*3/uL (ref 0.1–1.0)
Monocytes Relative: 10.5 % (ref 3.0–12.0)
Neutro Abs: 4.4 10*3/uL (ref 1.4–7.7)
Neutrophils Relative %: 61.2 % (ref 43.0–77.0)
Platelets: 199 10*3/uL (ref 150.0–400.0)
RBC: 4.59 Mil/uL (ref 4.22–5.81)
RDW: 13.6 % (ref 11.5–15.5)
WBC: 7.2 10*3/uL (ref 4.0–10.5)

## 2022-03-03 LAB — COMPREHENSIVE METABOLIC PANEL
ALT: 33 U/L (ref 0–53)
AST: 22 U/L (ref 0–37)
Albumin: 4.4 g/dL (ref 3.5–5.2)
Alkaline Phosphatase: 75 U/L (ref 39–117)
BUN: 15 mg/dL (ref 6–23)
CO2: 28 mEq/L (ref 19–32)
Calcium: 10 mg/dL (ref 8.4–10.5)
Chloride: 105 mEq/L (ref 96–112)
Creatinine, Ser: 1 mg/dL (ref 0.40–1.50)
GFR: 87.23 mL/min (ref >60.00)
Glucose, Bld: 94 mg/dL (ref 70–99)
Potassium: 4.1 mEq/L (ref 3.5–5.1)
Sodium: 139 mEq/L (ref 135–145)
Total Bilirubin: 0.6 mg/dL (ref 0.2–1.2)
Total Protein: 6.8 g/dL (ref 6.0–8.3)

## 2022-03-03 LAB — T4, FREE: Free T4: 0.87 ng/dL (ref 0.60–1.60)

## 2022-03-03 LAB — VITAMIN B12: Vitamin B-12: 526 pg/mL (ref 211–911)

## 2022-03-03 LAB — TESTOSTERONE: Testosterone: 286.25 ng/dL — ABNORMAL LOW (ref 300.00–890.00)

## 2022-03-03 LAB — TSH: TSH: 1.31 u[IU]/mL (ref 0.35–5.50)

## 2022-03-05 ENCOUNTER — Other Ambulatory Visit: Payer: Self-pay

## 2022-03-05 DIAGNOSIS — E349 Endocrine disorder, unspecified: Secondary | ICD-10-CM

## 2022-03-05 MED ORDER — "SYRINGE 22G X 1"" 3 ML MISC": 3 refills | Status: DC

## 2022-03-05 MED ORDER — "SYRINGE 18G X 1-1/2"" 3 ML MISC": 3 refills | Status: DC

## 2022-03-05 NOTE — Progress Notes (Signed)
If he has not missed any doses of testosterone in the last 3 weeks need to go up to the dose to 0.7 mL weekly, all other labs normal Need to follow-up with PCP for ongoing problems, please fax labs to PCP

## 2022-03-08 ENCOUNTER — Other Ambulatory Visit: Payer: Self-pay | Admitting: Endocrinology

## 2022-03-08 MED ORDER — TESTOSTERONE CYPIONATE 200 MG/ML IM SOLN: INTRAMUSCULAR | 3 refills | Status: DC

## 2022-04-16 ENCOUNTER — Encounter (HOSPITAL_BASED_OUTPATIENT_CLINIC_OR_DEPARTMENT_OTHER): Payer: Self-pay | Admitting: Pulmonary Disease

## 2022-04-16 ENCOUNTER — Ambulatory Visit (INDEPENDENT_AMBULATORY_CARE_PROVIDER_SITE_OTHER): Payer: 59 | Admitting: Pulmonary Disease

## 2022-04-16 VITALS — BP 118/70 | HR 87 | Ht 71.0 in | Wt 258.6 lb

## 2022-04-16 DIAGNOSIS — G4733 Obstructive sleep apnea (adult) (pediatric): Secondary | ICD-10-CM | POA: Diagnosis not present

## 2022-04-16 DIAGNOSIS — G4719 Other hypersomnia: Secondary | ICD-10-CM

## 2022-04-16 NOTE — Progress Notes (Signed)
[facility]  Chief Complaint  Patient presents with   Follow-up    Feels spaced out and brain fog     Past Surgical History:  He  has a past surgical history that includes Hemorrhoid surgery; Colonoscopy with propofol (N/A, 12/16/2021); and Polypectomy (12/16/2021).  Past Medical History:  RBBB, Cluster Headaches, ED, Depression, GERD, HTN, Low T, Nasal polyp, SBO  Constitutional:  BP 118/70 (BP Location: Left Arm, Cuff Size: Large)   Pulse 87   Ht 5\' 11"  (1.803 m)   Wt 258 lb 9.6 oz (117.3 kg)   SpO2 96%   BMI 36.07 kg/m   Brief Summary:  Devin Ray is a 52 y.o. male smoker with obstructive sleep apnea, and daytime sleepiness.       Subjective:   He had a titration study in 2022.  He was switched to Bipap.  He reports using nightly.  No issue with mask or pressure.  He goes to bed at 11 pm.  Falls asleep quickly.  Wakes up sometimes to use the bathroom.  He gets out of bed at 6 am on work days.  He can sleep up to 9 or 10 hours on weekends.  He always feels tired during the day no matter how long he sleeps.  He feels like he is in a fog.  He is planning to gradually quick smoking.  Physical Exam:   Appearance - well kempt   ENMT - no sinus tenderness, no oral exudate, no LAN, Mallampati 3 airway, no stridor  Respiratory - equal breath sounds bilaterally, no wheezing or rales  CV - s1s2 regular rate and rhythm, no murmurs  Ext - no clubbing, no edema  Skin - no rashes  Psych - normal mood and affect     Pulmonary testing:  PFT 08/04/16 >> FEV1 4.08 (94%), FEV1% 80, TLC 7.20 (97%), DLCO 90% PFT 11/10/20 >> FEV1 4.09 (100%), FEV1% 83, TLC 7.25 (101%), DLCO 94%  Sleep Tests:  PSG 05/03/12 >> AHI 88.9, SpO2 low 74% CPAP 09/03/20 to 10/02/20 >> used on 22 of 30 nights with average 6 hrs 7 min.  Average AHI 3.9 with CPAP 14 cm H2O Bipap titration 11/23/20 >> failed CPAP. Changed to Bipap 17/13 cm H2O >> AHI 0.6.    Cardiac Tests:  Echo 02/09/19 >> EF 55 to 60%  Social History:  He  reports that he has been smoking cigarettes. He started smoking about 37 years ago. He has a 46.50 pack-year smoking history. He has quit using smokeless tobacco.  His smokeless tobacco use included snuff. He reports that he does not currently use alcohol. He reports that he does not use drugs.  Family History:  His family history is negative for sleep apnea.     Assessment/Plan:   Obstructive sleep apnea. - he is compliant with BiPAP and reports benefit from therapy - he uses [facility] for his DME - he is using Bipap 17/13 cm H2O - will call with results of his Bipap download  Persistent daytime sleepiness. - if he doesn't need adjustment to sleep apnea therapy, then he would be a candidate for stimulant medication such as modafinil  Tobacco abuse. -  he will try to gradually quit  Obesity. - discussed how weight can impact sleep and risk for sleep disordered breathing - discussed options to assist with weight loss: combination of diet modification, cardiovascular and strength training exercises  Hypogonadotropic hypogonadism. - follow up with endocrinology  Time Spent Involved in Patient Care on Day of Examination:  36 minutes  Follow up:   Patient Instructions  Will get a copy of your Bipap report and call you with results  Follow up in 6 months  Medication List:   Allergies as of 04/16/2022   No Known Allergies      Medication List        Accurate as of April 16, 2022  4:00 PM. If you have any questions, ask your nurse or doctor.          SYRINGE 3CC/18GX1-1/2" 18G X 1-1/2" 3 ML Misc Use as instructed to draw up medication   SYRINGE 3CC/22GX1" 22G X 1" 3 ML Misc Use as instructed to inject medication   testosterone cypionate 200 MG/ML injection Commonly known as: DEPOTESTOSTERONE CYPIONATE INJECT 0.7 ML INTO THE MUSCLE EVERY 7 DAYS        Signature:  Coralyn Helling, MD [facility] Pulmonary/Critical Care Pager - [phone] 04/16/2022, 4:00 PM

## 2022-04-16 NOTE — Patient Instructions (Signed)
Will get a copy of your Bipap report and call you with results  Follow up in 6 months

## 2022-06-03 ENCOUNTER — Other Ambulatory Visit: Payer: 59

## 2022-06-07 ENCOUNTER — Ambulatory Visit: Payer: 59 | Admitting: Endocrinology

## 2023-02-09 ENCOUNTER — Encounter: Payer: Self-pay | Admitting: Endocrinology

## 2023-02-09 ENCOUNTER — Ambulatory Visit (INDEPENDENT_AMBULATORY_CARE_PROVIDER_SITE_OTHER): Payer: 59 | Admitting: Endocrinology

## 2023-02-09 VITALS — BP 132/72 | HR 88 | Ht 71.0 in | Wt 278.0 lb

## 2023-02-09 DIAGNOSIS — E010 Iodine-deficiency related diffuse (endemic) goiter: Secondary | ICD-10-CM

## 2023-02-09 DIAGNOSIS — E669 Obesity, unspecified: Secondary | ICD-10-CM | POA: Diagnosis not present

## 2023-02-09 DIAGNOSIS — R5382 Chronic fatigue, unspecified: Secondary | ICD-10-CM | POA: Diagnosis not present

## 2023-02-09 DIAGNOSIS — E291 Testicular hypofunction: Secondary | ICD-10-CM | POA: Diagnosis not present

## 2023-02-09 DIAGNOSIS — R7303 Prediabetes: Secondary | ICD-10-CM | POA: Diagnosis not present

## 2023-02-09 MED ORDER — PHENTERMINE HCL 15 MG PO CAPS: 15.0000 mg | ORAL_CAPSULE | ORAL | 4 refills | Status: DC

## 2023-02-09 MED ORDER — WEGOVY 0.5 MG/0.5ML ~~LOC~~ SOAJ: 0.5000 mg | SUBCUTANEOUS | 3 refills | Status: DC

## 2023-02-09 MED ORDER — WEGOVY 0.25 MG/0.5ML ~~LOC~~ SOAJ: 0.2500 mg | SUBCUTANEOUS | 0 refills | Status: DC

## 2023-02-09 NOTE — Progress Notes (Signed)
 Outpatient Endocrinology Note Emmalyn Hinson, MD   Patient's Name: Devin Ray    DOB: Mar 07, 1970    MRN: [id number]  REASON OF VISIT: Follow up  for hypogonadism  PCP:  Rosamond Leta NOVAK, MD  HISTORY OF PRESENT ILLNESS:   Devin Ray is a 53 y.o. old male with past medical history listed below, is here for follow up for hypogonadism/low testosterone .  He has complaints of fatigue and weight gain.  Pertinent Hx: - Patient was previously seen by Dr. Von and was last time seen in February 2024, had initial endocrinology consultation in September 2021.  He had low testosterone  at least from 2019 , probably diagnosed with hypogonadism in 2019.    - Patient has had complaints of fatigue, decreased motivation, decreased libido since 2016.  He was diagnosed with obstructive sleep apnea as well and was started on treatment.  In 2018 total testosterone  level was over 300.  Subsequently his testosterone  levels were low and was evaluated by endocrinologist, no history of hot flashes or sweating, no history of long-term anabolic steroid use, no history of testicular injury or known mump since childhood. Baseline evaluation including high normal LH, normal prolactin, free levels not available.  He was started on testosterone  injection, not clear why he did not try other forms of testosterone  supplement initially. Although he did feel better with starting injections he did not completely feel back to normal with his energy level or libido.  He later stopped taking testosterone  injection.   - He was started back on testosterone  injections, 50 mg every 2 weeks again in 5/21 from his PCP and was taking the injection weekly as he felt better with doing it weekly compared to every 2 weeks. Testosterone  level done 2 days after his injection in July was 394. He was first seen in endocrinology consultation in 9/21 with complaints of  fatigue, lethargy and somnolence and decreased libido. He had not taken injections for  about a month and his afternoon testosterone  was 234 although free testosterone  was normal at 9.9, and 6: Binding globulin was low normal 18.6.  He was started on 50 mg weekly of Depo testosterone  he did have some improvement in his energy level and fatigue.  Later testosterone  injection was increased to 60 mg weekly.  Interval history Patient presented for the follow-up of hypogonadism, was last time seen this in clinic by Dr. Von in February 2024. Patient has complaints of constant fatigue, low motivation, depressed mood, erectile dysfunction and low libido.  Patient reports having occasional early morning erection.  He used to have clustered headache, no longer having headache or peripheral vision loss.  He was taking testosterone  injection every week 60 mg, has not been taking for more than 3 months, he ran out and not able to refill.  He has sleep apnea diagnosed several years ago currently on BiPAP.    He also has concern about weight gain, BMI 38, he had tried diet plan, carnival e diet in the past.  Not able to do much exercise due to leg pain.  No detail records of initial hypogonadism workup available to review.  Patient denied having MRI pituitary in the past.  REVIEW OF SYSTEMS:  As per history of present illness.   PAST MEDICAL HISTORY: Past Medical History:  Diagnosis Date   Cluster headache    Depression    ED (erectile dysfunction)    GERD (gastroesophageal reflux disease)    Hypertension    Insomnia  Low testosterone     Nasal polyp    SBO (small bowel obstruction) ([facility]) 10/12/2017   Sleep apnea     PAST SURGICAL HISTORY: Past Surgical History:  Procedure Laterality Date   COLONOSCOPY WITH PROPOFOL  N/A 12/16/2021   Procedure: COLONOSCOPY WITH PROPOFOL ;  Surgeon: Eartha Angelia Sieving, MD;  [facility];  Service: Gastroenterology;  Laterality: N/A;  2:00pm, asa 1-2   HEMORRHOID SURGERY     POLYPECTOMY  12/16/2021   Procedure: POLYPECTOMY  INTESTINAL;  Surgeon: Eartha Angelia Sieving, MD;  [facility];  Service: Gastroenterology;;    ALLERGIES: No Known Allergies  FAMILY HISTORY:  History reviewed. No pertinent family history.  SOCIAL HISTORY: Social History   Socioeconomic History   Marital status: Married    Spouse name: Not on file   Number of children: Not on file   Years of education: Not on file   Highest education level: Not on file  Occupational History   Not on file  Tobacco Use   Smoking status: Every Day    Current packs/day: 1.50    Average packs/day: 1.5 packs/day for 38.3 years (57.5 ttl pk-yrs)    Types: Cigarettes    Start date: 10/17/1984   Smokeless tobacco: Former    Types: Snuff   Tobacco comments:    smokes 1.5 pack per day 04/28/2020  Vaping Use   Vaping status: Never Used  Substance and Sexual Activity   Alcohol use: Not Currently    Comment: occasional   Drug use: Never   Sexual activity: Not on file  Other Topics Concern   Not on file  Social History Narrative   Not on file   Social Drivers of Health   Financial Resource Strain: Not on file  Food Insecurity: Not on file  Transportation Needs: Not on file  Physical Activity: Not on file  Stress: Not on file  Social Connections: Unknown (05/15/2021)   Received from [facility]   Social Network    Social Network: Not on file    MEDICATIONS:  Current Outpatient Medications  Medication Sig Dispense Refill   phentermine  15 MG capsule Take 1 capsule (15 mg total) by mouth every morning. 30 capsule 4   Semaglutide -Weight Management (WEGOVY ) 0.25 MG/0.5ML SOAJ Inject 0.25 mg into the skin once a week. 2 mL 0   Semaglutide -Weight Management (WEGOVY ) 0.5 MG/0.5ML SOAJ Inject 0.5 mg into the skin once a week. After completion 0.25 mg dose for 4 weeks. 6 mL 3   Syringe/Needle, Disp, (SYRINGE 3CC/18GX1-1/2) 18G X 1-1/2 3 ML MISC Use as instructed to draw up medication (Patient not taking: Reported  on 02/09/2023) 100 each 3   Syringe/Needle, Disp, (SYRINGE 3CC/22GX1) 22G X 1 3 ML MISC Use as instructed to inject medication (Patient not taking: Reported on 02/09/2023) 100 each 3   testosterone  cypionate (DEPOTESTOSTERONE CYPIONATE) 200 MG/ML injection INJECT 0.7 ML INTO THE MUSCLE EVERY 7 DAYS (Patient not taking: Reported on 02/09/2023) 4 mL 3   No current facility-administered medications for this visit.    PHYSICAL EXAM: Vitals:   02/09/23 1551  BP: 132/72  Pulse: 88  SpO2: 96%  Weight: 278 lb (126.1 kg)  Height: 5' 11 (1.803 m)   Body mass index is 38.77 kg/m.  Wt Readings from Last 3 Encounters:  02/09/23 278 lb (126.1 kg)  04/16/22 258 lb 9.6 oz (117.3 kg)  03/03/22 269 lb 12.8 oz (122.4 kg)    General: Well developed, well nourished male in no  apparent distress. Non-Cushingoid. No obvious Klinefelter's syndrome body habitus HEENT: AT/[place], no external lesions. Hearing intact to the spoken word Eyes: EOMI. No proptosis, stare and lid lag. Conjunctiva clear and no icterus. Visual field grossly intact in confrontation Neck: Trachea midline, neck supple with mild appreciable thyromegaly or lymphadenopathy and no palpable thyroid  nodules Lungs: Clear to auscultation, no wheeze. Respirations not labored Heart: S1S2, Regular in rate and rhythm. Abdomen: Soft, non tender, non distended, no masses, no striae Neurologic: Alert, oriented, normal speech, deep tendon biceps reflexes normal,  no gross focal neurological deficit. No obvious proximal weakness Extremities: No pedal pitting edema, no tremors of outstretched hands, no excessive hair growth Skin: Warm, color good.  Psychiatric: Does not appear depressed or anxious GU: Penis normal, testicles 20 ml bilaterally without masses and soft, normal pubic hair.   PERTINENT HISTORIC LABORATORY AND IMAGING STUDIES:  All pertinent laboratory results were reviewed. Please see HPI also for further details.   ASSESSMENT / PLAN  1.  Hypogonadism in male   2. Prediabetes   3. Chronic fatigue   4. Obesity (BMI 30-39.9)   5. Thyromegaly    -Patient was diagnosed with hypogonadism in 2019, has been treated with testosterone  injections.  Complains of significant fatigue, low libido and erectile dysfunction, low motivation, depressed mood.  No detail records of initial evaluation of hypogonadism at the time of diagnosis he is available to review.  He denies having MRI pituitary in the past. -He was taking testosterone  cypionate 60 mg every week, has not taken for more than 3 months.  Plan: -Will check total and free testosterone  along with gonadotropin LH, FSH and prolactin. -I would also like to check sex hormone binding globulin. -Will consider MRI pituitary based on laboratory results, will discuss with patient again regarding this.    # Obesity BMI 38 -Discussed in detail about lifestyle modification with diet plan, intermittent fasting and exercise. -Ordered Wegovy  0.25 mg weekly and increase to 0.5 mg weekly, patient will check insurance coverage. -Start phentermine  15 mg daily.  Discussed that this is short-term use for 3 months.  # Patient had prediabetes with hemoglobin A1c 5.7% in the past : Will check hemoglobin A1c  # Chronic fatigue : Discussed that this can be multifactorial including use obstructive sleep apnea.  He has been on BiPAP. -Will check thyroid  function test and vitamin D  level  # He has mild thyromegaly on exam will check ultrasound thyroid .  Patient mother has history of thyroid  disorder probably toxic multinodular goiter.  No family history of thyroid  cancer.  Patient will complete lab in the morning fasting.  Diagnoses and all orders for this visit:  Hypogonadism in male -     Sex hormone binding globulin -     Testosterone , Free, LC/MS/MS -     Testosterone , Total, LC/MS/MS -     Luteinizing hormone -     Follicle stimulating hormone -     Prolactin  Prediabetes -     Hemoglobin  A1c  Chronic fatigue -     TSH -     T4, free -     VITAMIN D  25 Hydroxy (Vit-D Deficiency, Fractures)  Obesity (BMI 30-39.9) -     phentermine  15 MG capsule; Take 1 capsule (15 mg total) by mouth every morning. -     Semaglutide -Weight Management (WEGOVY ) 0.25 MG/0.5ML SOAJ; Inject 0.25 mg into the skin once a week. -     Semaglutide -Weight Management (WEGOVY ) 0.5 MG/0.5ML SOAJ; Inject 0.5 mg  into the skin once a week. After completion 0.25 mg dose for 4 weeks.  Thyromegaly -     US  THYROID ; Future    DISPOSITION Follow up in clinic in 1 months suggested.  All questions answered and patient verbalized understanding of the plan.  Caesar Mannella, MD [facility] Endocrinology [facility] [address] [place], [address] [place], [place] [address] Phone # [phone]  At least part of this note was generated using voice recognition software. Inadvertent word errors may have occurred, which were not recognized during the proofreading process.

## 2023-02-09 NOTE — Patient Instructions (Signed)
 Complete lab in the morning fasting early morning 7 to 8 AM.  Labs regarding testosterone  levels, thyroid , vitamin D .  Will plan for ultrasound thyroid .  I have sent prescription for phentermine  to help with losing weight.  I have also sent prescription for Wegovy  to check with pharmacy with the pharmacy if it is covered.

## 2023-02-14 ENCOUNTER — Other Ambulatory Visit: Payer: 59

## 2023-02-15 ENCOUNTER — Ambulatory Visit
Admission: RE | Admit: 2023-02-15 | Discharge: 2023-02-15 | Disposition: A | Discharge status: Home / Self Care | Payer: 59 | Source: Ambulatory Visit | Attending: Endocrinology | Admitting: Endocrinology

## 2023-02-15 DIAGNOSIS — E010 Iodine-deficiency related diffuse (endemic) goiter: Secondary | ICD-10-CM

## 2023-02-16 ENCOUNTER — Encounter: Payer: Self-pay | Admitting: Endocrinology

## 2023-03-03 ENCOUNTER — Other Ambulatory Visit: Payer: Self-pay

## 2023-03-09 ENCOUNTER — Other Ambulatory Visit: Payer: 59

## 2023-03-10 ENCOUNTER — Encounter: Payer: Self-pay | Admitting: Endocrinology

## 2023-03-10 DIAGNOSIS — E119 Type 2 diabetes mellitus without complications: Secondary | ICD-10-CM

## 2023-03-13 LAB — HEMOGLOBIN A1C
Hgb A1c MFr Bld: 6.6 %{Hb} — ABNORMAL HIGH (ref <5.7)
Mean Plasma Glucose: 143 mg/dL
eAG (mmol/L): 7.9 mmol/L

## 2023-03-13 LAB — TESTOSTERONE, FREE: TESTOSTERONE FREE: 44.6 pg/mL — ABNORMAL LOW (ref 46.0–224.0)

## 2023-03-13 LAB — TESTOSTERONE, TOTAL, LC/MS/MS: Testosterone, Total, LC-MS-MS: 225 ng/dL — ABNORMAL LOW (ref 250–1100)

## 2023-03-13 LAB — VITAMIN D 25 HYDROXY (VIT D DEFICIENCY, FRACTURES): Vit D, 25-Hydroxy: 26 ng/mL — ABNORMAL LOW (ref 30–100)

## 2023-03-13 LAB — LUTEINIZING HORMONE: LH: 4.1 m[IU]/mL (ref 1.5–9.3)

## 2023-03-13 LAB — PROLACTIN: Prolactin: 4.1 ng/mL (ref 2.0–18.0)

## 2023-03-13 LAB — T4, FREE: Free T4: 1.2 ng/dL (ref 0.8–1.8)

## 2023-03-13 LAB — FOLLICLE STIMULATING HORMONE: FSH: 6 m[IU]/mL (ref 1.4–12.8)

## 2023-03-13 LAB — SEX HORMONE BINDING GLOBULIN: Sex Hormone Binding: 15 nmol/L (ref 10–50)

## 2023-03-13 LAB — TSH: TSH: 1 m[IU]/L (ref 0.40–4.50)

## 2023-03-14 ENCOUNTER — Telehealth: Payer: Self-pay

## 2023-03-14 ENCOUNTER — Ambulatory Visit (INDEPENDENT_AMBULATORY_CARE_PROVIDER_SITE_OTHER): Payer: 59 | Admitting: Endocrinology

## 2023-03-14 ENCOUNTER — Other Ambulatory Visit (HOSPITAL_COMMUNITY): Payer: Self-pay

## 2023-03-14 ENCOUNTER — Encounter: Payer: Self-pay | Admitting: Endocrinology

## 2023-03-14 VITALS — BP 114/72 | HR 70 | Resp 16 | Ht 71.0 in | Wt 272.0 lb

## 2023-03-14 DIAGNOSIS — G4733 Obstructive sleep apnea (adult) (pediatric): Secondary | ICD-10-CM | POA: Diagnosis not present

## 2023-03-14 DIAGNOSIS — Z7985 Long-term (current) use of injectable non-insulin antidiabetic drugs: Secondary | ICD-10-CM

## 2023-03-14 DIAGNOSIS — E119 Type 2 diabetes mellitus without complications: Secondary | ICD-10-CM

## 2023-03-14 DIAGNOSIS — E559 Vitamin D deficiency, unspecified: Secondary | ICD-10-CM

## 2023-03-14 DIAGNOSIS — E669 Obesity, unspecified: Secondary | ICD-10-CM | POA: Diagnosis not present

## 2023-03-14 DIAGNOSIS — E291 Testicular hypofunction: Secondary | ICD-10-CM

## 2023-03-14 MED ORDER — TIRZEPATIDE-WEIGHT MANAGEMENT 2.5 MG/0.5ML ~~LOC~~ SOLN: 2.5000 mg | SUBCUTANEOUS | 0 refills | Status: DC

## 2023-03-14 MED ORDER — TIRZEPATIDE 2.5 MG/0.5ML ~~LOC~~ SOAJ: SUBCUTANEOUS | 4 refills | Status: DC

## 2023-03-14 MED ORDER — LANCETS MISC. MISC: 1.0000 | Freq: Every day | 3 refills | Status: AC

## 2023-03-14 MED ORDER — SEMAGLUTIDE(0.25 OR 0.5MG/DOS) 2 MG/3ML ~~LOC~~ SOPN
PEN_INJECTOR | SUBCUTANEOUS | 4 refills | Status: DC
Start: 2023-03-14 — End: 2023-09-08

## 2023-03-14 MED ORDER — TIRZEPATIDE-WEIGHT MANAGEMENT 5 MG/0.5ML ~~LOC~~ SOLN: 5.0000 mg | SUBCUTANEOUS | 11 refills | Status: DC

## 2023-03-14 MED ORDER — BLOOD GLUCOSE TEST VI STRP: 1.0000 | ORAL_STRIP | Freq: Every day | 3 refills | Status: DC

## 2023-03-14 MED ORDER — BLOOD GLUCOSE MONITORING SUPPL DEVI: 1.0000 | Freq: Three times a day (TID) | 0 refills | Status: AC

## 2023-03-14 MED ORDER — LANCET DEVICE MISC: 1.0000 | Freq: Three times a day (TID) | 0 refills | Status: AC

## 2023-03-14 NOTE — Telephone Encounter (Signed)
 Pharmacy Patient Advocate Encounter   Received notification from CoverMyMeds that prior authorization for Zepbound is required/requested.   Insurance verification completed.   The patient is insured through [facility] .   Per test claim: PA required; PA submitted to above mentioned insurance via CoverMyMeds Key/confirmation #/EOC [id number] Status is pending

## 2023-03-14 NOTE — Progress Notes (Signed)
 Outpatient Endocrinology Note [place] Nareh Matzke, MD   Patient's Name: Devin Ray    DOB: 1970/03/10    MRN: [id number]                                                    REASON OF VISIT: Follow up for type 2 diabetes mellitus / hypogonadism  PCP: Ignatius Specking, MD  HISTORY OF PRESENT ILLNESS:   Devin Ray is a 53 y.o. old male with past medical history listed below, is here for follow up for type 2 diabetes mellitus and hypogonadism /obesity.  Pertinent Diabetes History: Patient was being followed up in this clinic for hypogonadism, with a visit in January 2025 evaluated for obesity, fatigue and prediabetes, he has newly diagnosed type 2 diabetes mellitus with hemoglobin A1c of 6.6% in March 2025.  No personal history of pancreatitis and / or family history of medullary thyroid carcinoma or MEN 2B syndrome.   Chronic Diabetes Complications : Retinopathy: Unknown. Last ophthalmology exam was done on Due.  Complains of blurred vision. Nephropathy: no Peripheral neuropathy: no Coronary artery disease: no Stroke: no  Relevant comorbidities and cardiovascular risk factors: Obesity: yes Body mass index is 37.94 kg/m.  Hypertension: no Hyperlipidemia :no  Current / Home Diabetic regimen includes:  None  Prior diabetic medications: None  Glycemic data:   He has not been monitoring blood sugar at home.  Hypoglycemia: Patient has on hypoglycemic episodes. Patient has hypoglycemia awareness.  Factors modifying glucose control: 1.  Diabetic diet assessment: 3 meals a day.  2.  Staying active or exercising: Walking.  3.  Medication compliance: compliant n/a of the time.  # Hypogonadism : - Patient was previously seen by Dr. Lucianne Muss and was last time seen in February 2024, had initial endocrinology consultation in September 2021.  He had low testosterone at least from 2019 , probably diagnosed with hypogonadism in 2019.    - Patient has had complaints of fatigue, decreased  motivation, decreased libido since 2016.  He was diagnosed with obstructive sleep apnea as well and was started on treatment.  In 2018 total testosterone level was over 300.  Subsequently his testosterone levels were low and was evaluated by endocrinologist, no history of hot flashes or sweating, no history of long-term anabolic steroid use, no history of testicular injury or known mump since childhood. Baseline evaluation including high normal LH, normal prolactin, free levels not available.  He was started on testosterone injection, not clear why he did not try other forms of testosterone supplement initially. Although he did feel better with starting injections he did not completely feel back to normal with his energy level or libido.  He later stopped taking testosterone injection.   - He was started back on testosterone injections, 50 mg every 2 weeks again in 5/21 from his PCP and was taking the injection weekly as he felt better with doing it weekly compared to every 2 weeks. Testosterone level done 2 days after his injection in July was 394. He was first seen in endocrinology consultation in 9/21 with complaints of  fatigue, lethargy and somnolence and decreased libido. He had not taken injections for about a month and his afternoon testosterone was 234 although free testosterone was normal at 9.9, and 6: Binding globulin was low normal 18.6.  He was started on 37  mg weekly of Depo testosterone he did have some improvement in his energy level and fatigue.  Later testosterone injection was increased to 60 mg weekly.  He stopped taking testosterone injection few months prior to follow-up visit in January 2025.  # Patient has obstructive sleep apnea currently on BiPAP.  # Patient has obesity BMI 37 : Started on phentermine in January 2025.  Interval history Patient presented for the follow-up of recent laboratory test results and make further plan.  Patient has newly diagnosed type 2 diabetes mellitus  with hemoglobin A1c of 6.6%.  He complains of blurred vision, increased urinary frequency and increased thirst.  In regard to testosterone total testosterone is mildly low with low sex hormone binding globulin, in this context total testosterone level is appropriate.  He has mildly low free testosterone.  He has normal gonadotropins and prolactin.  Thyroid function test normal.  He has mildly low vitamin D level 26.   Latest Reference Range & Units 03/09/23 07:58  LH 1.5 - 9.3 mIU/mL 4.1  FSH 1.4 - 12.8 mIU/mL 6.0  Prolactin 2.0 - 18.0 ng/mL 4.1  eAG (mmol/L) mmol/L 7.9  Hemoglobin A1C <5.7 % of total Hgb 6.6 (H)  Sex Horm Binding Glob, Serum 10 - 50 nmol/L 15  Testosterone, Total, LC-MS-MS 250 - 1,100 ng/dL 454 (L)  Testosterone Free 46.0 - 224.0 pg/mL 44.6 (L)  TSH 0.40 - 4.50 mIU/L 1.00  T4,Free(Direct) 0.8 - 1.8 ng/dL 1.2  (H): Data is abnormally high (L): Data is abnormally low   Latest Reference Range & Units 03/09/23 07:58  Mean Plasma Glucose mg/dL 098  Vitamin D, 11-BJYNWGN 30 - 100 ng/mL 26 (L)  (L): Data is abnormally low  He was started on phentermine in July 2025, lost about 5 pounds of weight in last 1 month.  No palpitation or heat intolerance.    Ultrasound thyroid in February 2025 with normal findings, with no thyroid nodules.  REVIEW OF SYSTEMS As per history of present illness.   PAST MEDICAL HISTORY: Past Medical History:  Diagnosis Date   Cluster headache    Depression    ED (erectile dysfunction)    GERD (gastroesophageal reflux disease)    Hypertension    Insomnia    Low testosterone    Nasal polyp    SBO (small bowel obstruction) (HCC) 10/12/2017   Sleep apnea     PAST SURGICAL HISTORY: Past Surgical History:  Procedure Laterality Date   COLONOSCOPY WITH PROPOFOL N/A 12/16/2021   Procedure: COLONOSCOPY WITH PROPOFOL;  Surgeon: Dolores Frame, MD;  Location: [facility];  Service: Gastroenterology;  Laterality: N/A;  2:00pm, asa  1-2   HEMORRHOID SURGERY     POLYPECTOMY  12/16/2021   Procedure: POLYPECTOMY INTESTINAL;  Surgeon: Dolores Frame, MD;  Location: [facility];  Service: Gastroenterology;;    ALLERGIES: No Known Allergies  FAMILY HISTORY:  History reviewed. No pertinent family history.  SOCIAL HISTORY: Social History   Socioeconomic History   Marital status: Married    Spouse name: Not on file   Number of children: Not on file   Years of education: Not on file   Highest education level: Not on file  Occupational History   Not on file  Tobacco Use   Smoking status: Every Day    Current packs/day: 1.50    Average packs/day: 1.5 packs/day for 38.4 years (57.6 ttl pk-yrs)    Types: Cigarettes    Start date: 10/17/1984   Smokeless tobacco: Former  Types: Snuff   Tobacco comments:    smokes 1.5 pack per day 04/28/2020  Vaping Use   Vaping status: Never Used  Substance and Sexual Activity   Alcohol use: Not Currently    Comment: occasional   Drug use: Never   Sexual activity: Not on file  Other Topics Concern   Not on file  Social History Narrative   Not on file   Social Drivers of Health   Financial Resource Strain: Not on file  Food Insecurity: Not on file  Transportation Needs: Not on file  Physical Activity: Not on file  Stress: Not on file  Social Connections: Unknown (05/15/2021)   Received from [facility]   Social Network    Social Network: Not on file    MEDICATIONS:  Current Outpatient Medications  Medication Sig Dispense Refill   Blood Glucose Monitoring Suppl DEVI 1 each by Does not apply route in the morning, at noon, and at bedtime. May substitute to any manufacturer covered by patient's insurance. 1 each 0   Glucose Blood (BLOOD GLUCOSE TEST STRIPS) STRP 1 each by In Vitro route daily. May substitute to any manufacturer covered by patient's insurance. 100 each 3   Lancet Device MISC 1 each by Does not apply route in the morning, at  noon, and at bedtime. May substitute to any manufacturer covered by patient's insurance. 1 each 0   Lancets Misc. MISC 1 each by Does not apply route daily. May substitute to any manufacturer covered by patient's insurance. 100 each 3   phentermine 15 MG capsule Take 1 capsule (15 mg total) by mouth every morning. 30 capsule 4   Semaglutide,0.25 or 0.5MG /DOS, 2 MG/3ML SOPN Inject 0.25 mg into the skin once a week for 4 weeks and increase to 0.5 mg weekly. 3 mL 4   tirzepatide (MOUNJARO) 2.5 MG/0.5ML Pen Inject 2.5 mg into the skin once a week for 4 weeks and then increase to 5 mg weekly. 2 mL 4   Syringe/Needle, Disp, (SYRINGE 3CC/18GX1-1/2") 18G X 1-1/2" 3 ML MISC Use as instructed to draw up medication (Patient not taking: Reported on 03/14/2023) 100 each 3   Syringe/Needle, Disp, (SYRINGE 3CC/22GX1") 22G X 1" 3 ML MISC Use as instructed to inject medication (Patient not taking: Reported on 03/14/2023) 100 each 3   testosterone cypionate (DEPOTESTOSTERONE CYPIONATE) 200 MG/ML injection INJECT 0.7 ML INTO THE MUSCLE EVERY 7 DAYS (Patient not taking: Reported on 03/14/2023) 4 mL 3   No current facility-administered medications for this visit.    PHYSICAL EXAM: Vitals:   03/14/23 1410  BP: 114/72  Pulse: 70  Resp: 16  SpO2: 96%  Weight: 272 lb (123.4 kg)  Height: 5\' 11"  (1.803 m)   Body mass index is 37.94 kg/m.  Wt Readings from Last 3 Encounters:  03/14/23 272 lb (123.4 kg)  02/09/23 278 lb (126.1 kg)  04/16/22 258 lb 9.6 oz (117.3 kg)    General: Well developed, well nourished male in no apparent distress.  HEENT: AT/[place], no external lesions.  Eyes: Conjunctiva clear and no icterus. Neck: Neck supple  Lungs: Respirations not labored Neurologic: Alert, oriented, normal speech Extremities / Skin: Dry. No sores or rashes noted.  Psychiatric: Does not appear depressed or anxious  Diabetic Foot Exam - Simple   No data filed    LABS Reviewed Lab Results  Component Value Date    HGBA1C 6.6 (H) 03/09/2023   HGBA1C 5.7 (H) 07/19/2017   No results found for: "FRUCTOSAMINE"  No results found for: "CHOL", "HDL", "LDLCALC", "LDLDIRECT", "TRIG", "CHOLHDL" No results found for: "MICRALBCREAT" Lab Results  Component Value Date   CREATININE 1.00 03/03/2022   Lab Results  Component Value Date   GFR 87.23 03/03/2022    ASSESSMENT / PLAN  1. Type 2 diabetes mellitus without complication, without long-term current use of insulin (HCC)   2. Obesity (BMI 30-39.9)   3. Obstructive sleep apnea   4. Vitamin D deficiency   5. Hypogonadism in male     Diabetes Mellitus type 2, complicated by no known complications.  Newly diagnosed. - Diabetic status / severity: Uncontrolled.  Lab Results  Component Value Date   HGBA1C 6.6 (H) 03/09/2023    - Hemoglobin A1c goal : <6.5%  Discussed about type 2 diabetes mellitus and potential chronic complications including retinopathy, neuropathy and nephropathy.  Patient has not been on diabetic medication.  He has history of prediabetes.  Discussed about diet control.  He will greatly benefit from weight loss.  He has other comorbidities including obstructive sleep apnea probably contributed by obesity.  - Medications: See below.   Start Ozempic 0.25 mg weekly for 4 weeks and increase to 0.5 mg weekly.  I have also sent prescription for Mounjaro 2.5 mg weekly for 4 weeks and increase to 5 mg weekly.  Prescription showing requires prior authorization today.  You can take Ozempic or Greggory Keen which ever is covered by her medical insurance.  This medicines are also for the weight loss, similar to [facility] or Zepbound respectively.  Patient is asked to call in 2 months if he is tolerating okay will increase dose of Ozempic or Mounjaro in between the visits.  - Home glucose testing: In the morning fasting daily.  Sent glucometer and test supplies.  - Discussed/ Gave Hypoglycemia treatment plan.  # Consult : not required at this  time.   # Annual urine for microalbuminuria/ creatinine ratio, no microalbuminuria currently.  Will check in the future visits. Last No results found for: "MICRALBCREAT"  # Foot check nightly.  # Annual dilated diabetic eye exams.  Will advise in the future visits.  - Diet: Make healthy diabetic food choices, discussed in detail. - Life style / activity / exercise: Discussed.  2. Blood pressure  -  BP Readings from Last 1 Encounters:  03/14/23 114/72    - Control is in target.  - No change in current plans.  3. Lipid status / Hyperlipidemia - Last No results found for: "LDLCALC" -Currently not on a statin.  Will monitor in the future visits.  # Hypogonadism - Patient was diagnosed with hypogonadism in 2019, has been treated with testosterone injections.  Complains of significant fatigue, low libido and erectile dysfunction, low motivation, depressed mood.  No detail records of initial evaluation of hypogonadism at the time of diagnosis he is available to review.  He denies having MRI pituitary in the past. -He was taking testosterone cypionate 60 mg every week, has not taken few months. -He had been on and off of testosterone supplements in the past. -Recent testosterone level total level is appropriate in the context of having low sex hormone binding globulin of 15, free testosterone is mildly low and normal gonadotropins and prolactin.  Plan: -No plan to restart testosterone therapy.  Would like to see with improvement of testosterone level after weight loss.  Patient prefers this strategy to lose weight. -Will recheck testosterone total and free level in the future in few months after losing significant amount of  weight.  If he continued to have low free testosterone levels, we need to consider MRI pituitary. -Patient has been started on GLP-1 receptor agonist which will also help to lose weight.  Patient will also try his best for lifestyle modification with diet and  exercise.  # Obesity BMI 37 -Discussed in detail about lifestyle modification with diet plan, intermittent fasting and exercise. -Continue phentermine 15 mg daily.  Discussed that this is short-term use for 3 months.  He lost about 5 pounds of weight in 1 month. -Started GLP-1 receptor agonist for type 2 diabetes mellitus, which will also help with losing weight. -Continue phentermine 15 mg daily.  # Vitamin D deficiency -Advised to start vitamin D3 over-the-counter 1000 international unit daily.  Diagnoses and all orders for this visit:  Type 2 diabetes mellitus without complication, without long-term current use of insulin (HCC) -     tirzepatide (MOUNJARO) 2.5 MG/0.5ML Pen; Inject 2.5 mg into the skin once a week for 4 weeks and then increase to 5 mg weekly. -     Semaglutide,0.25 or 0.5MG /DOS, 2 MG/3ML SOPN; Inject 0.25 mg into the skin once a week for 4 weeks and increase to 0.5 mg weekly. -     Blood Glucose Monitoring Suppl DEVI; 1 each by Does not apply route in the morning, at noon, and at bedtime. May substitute to any manufacturer covered by patient's insurance. -     Glucose Blood (BLOOD GLUCOSE TEST STRIPS) STRP; 1 each by In Vitro route daily. May substitute to any manufacturer covered by patient's insurance. -     Lancet Device MISC; 1 each by Does not apply route in the morning, at noon, and at bedtime. May substitute to any manufacturer covered by patient's insurance. -     Lancets Misc. MISC; 1 each by Does not apply route daily. May substitute to any manufacturer covered by patient's insurance.  Obesity (BMI 30-39.9) -     Discontinue: tirzepatide (ZEPBOUND) 2.5 MG/0.5ML injection vial; Inject 2.5 mg into the skin once a week. -     Discontinue: tirzepatide 5 MG/0.5ML injection vial; Inject 5 mg into the skin once a week. After completion of 4 weeks of 2.5 mg weekly.  Obstructive sleep apnea -     Discontinue: tirzepatide (ZEPBOUND) 2.5 MG/0.5ML injection vial; Inject 2.5  mg into the skin once a week. -     Discontinue: tirzepatide 5 MG/0.5ML injection vial; Inject 5 mg into the skin once a week. After completion of 4 weeks of 2.5 mg weekly.  Vitamin D deficiency  Hypogonadism in male    DISPOSITION Follow up in clinic in 3  months suggested.   All questions answered and patient verbalized understanding of the plan.  [place] Panfilo Ketchum, MD [facility] Endocrinology [facility] [address] [place], [address] [place], [place] [address] Phone # [phone]  At least part of this note was generated using voice recognition software. Inadvertent word errors may have occurred, which were not recognized during the proofreading process.

## 2023-03-14 NOTE — Telephone Encounter (Signed)
 Patient states that the pharmacy told him he needed PA on wegovy. Please attempt PA.

## 2023-03-14 NOTE — Telephone Encounter (Signed)
 Pharmacy Patient Advocate Encounter   Received notification from Pt Calls Messages that prior authorization for [facility] is required/requested.   Insurance verification completed.   The patient is insured through [facility] .   Per test claim: PA required; PA submitted to above mentioned insurance via CoverMyMeds Key/confirmation #/EOC [id number] Status is pending

## 2023-03-14 NOTE — Patient Instructions (Addendum)
   Diabetes regimen  Start Ozempic 0.25 mg weekly for 4 weeks and increase to 0.5 mg weekly.  I have also sent prescription for Mounjaro 2.5 mg weekly for 4 weeks and increase to 5 mg weekly.  You can take Ozempic or Greggory Keen which ever is covered by her medical insurance.  This medicines are also for the weight loss, similar to [facility] or Zepbound respectively.  Take vitamin D3 1000 international unit daily it is over-the-counter.   After 2 months please let me know if you do not have any problem with Ozempic or Mounjaro I can send you the new prescription.  I have sent prescription for glucometer to check blood sugar every day in the morning fasting.

## 2023-03-16 MED ORDER — METFORMIN HCL ER 500 MG PO TB24: ORAL_TABLET | ORAL | 2 refills | Status: DC

## 2023-03-16 NOTE — Telephone Encounter (Signed)
 I sent you prescription for Ozempic and Mounjaro.  Can you please clarify both of these medications are denied.  Please ask with the pharmacy or call your insurance which of this class of medication will be covered for your type 2 diabetes mellitus that includes Ozempic or Mounjaro or Trulicity?  And let us know.    In the meantime I would like to start you on metformin as follows.  Metformin extended release 500 mg 1 tablet daily for 1 week and 2 tablet daily for second week, 3 tablet daily for third week and 4 tablets daily after that.  [place] Jaceon Heiberger, MD [facility] Endocrinology [facility] [address] [place], [address] [place], [place] [address] Phone # [phone]

## 2023-03-22 NOTE — Telephone Encounter (Signed)
 Pharmacy Patient Advocate Encounter  Received notification from [facility] that Prior Authorization for Reginal Lutes has been DENIED.  See denial reason below. No denial letter attached in CMM. Will attach denial letter to Media tab once received.    PA #/Case ID/Reference #: [id number]

## 2023-03-22 NOTE — Telephone Encounter (Signed)
 Pharmacy Patient Advocate Encounter  Received notification from [facility] that Prior Authorization for Zepbound has been DENIED.  See denial reason below. No denial letter attached in CMM. Will attach denial letter to Media tab once received.    PA #/Case ID/Reference #: [id number]

## 2023-06-14 ENCOUNTER — Ambulatory Visit: Admitting: Endocrinology

## 2023-06-21 ENCOUNTER — Ambulatory Visit: Payer: Self-pay | Admitting: Endocrinology

## 2023-06-21 ENCOUNTER — Ambulatory Visit (INDEPENDENT_AMBULATORY_CARE_PROVIDER_SITE_OTHER): Admitting: Endocrinology

## 2023-06-21 ENCOUNTER — Encounter: Payer: Self-pay | Admitting: Endocrinology

## 2023-06-21 VITALS — BP 118/70 | HR 69 | Resp 20 | Ht 71.0 in | Wt 264.0 lb

## 2023-06-21 DIAGNOSIS — E119 Type 2 diabetes mellitus without complications: Secondary | ICD-10-CM

## 2023-06-21 DIAGNOSIS — E669 Obesity, unspecified: Secondary | ICD-10-CM | POA: Diagnosis not present

## 2023-06-21 DIAGNOSIS — Z7985 Long-term (current) use of injectable non-insulin antidiabetic drugs: Secondary | ICD-10-CM

## 2023-06-21 DIAGNOSIS — E291 Testicular hypofunction: Secondary | ICD-10-CM | POA: Diagnosis not present

## 2023-06-21 DIAGNOSIS — E559 Vitamin D deficiency, unspecified: Secondary | ICD-10-CM

## 2023-06-21 DIAGNOSIS — Z7984 Long term (current) use of oral hypoglycemic drugs: Secondary | ICD-10-CM

## 2023-06-21 LAB — POCT GLYCOSYLATED HEMOGLOBIN (HGB A1C): Hemoglobin A1C: 6.1 % — AB (ref 4.0–5.6)

## 2023-06-21 MED ORDER — TRULICITY 1.5 MG/0.5ML ~~LOC~~ SOAJ: 1.5000 mg | SUBCUTANEOUS | 4 refills | Status: DC

## 2023-06-21 MED ORDER — TRULICITY 0.75 MG/0.5ML ~~LOC~~ SOAJ
SUBCUTANEOUS | 0 refills | Status: DC
Start: 2023-06-21 — End: 2023-09-27

## 2023-06-21 NOTE — Progress Notes (Signed)
 Outpatient Endocrinology Note [place] Jaiven Graveline, MD   Patient's Name: Devin Ray    DOB: [birth year]    MRN: [id number]                                                    REASON OF VISIT: Follow up for type 2 diabetes mellitus / hypogonadism  PCP: Orlena Bitters, MD  HISTORY OF PRESENT ILLNESS:   Devin Ray is a 53 y.o. old male with past medical history listed below, is here for follow up for type 2 diabetes mellitus and hypogonadism /obesity.  Pertinent Diabetes History: Patient was being followed up in this clinic for hypogonadism, with a visit in January 2025 evaluated for obesity, fatigue and prediabetes, he has newly diagnosed type 2 diabetes mellitus with hemoglobin A1c of 6.6% in March 2025.  No personal history of pancreatitis and / or family history of medullary thyroid  carcinoma or MEN 2B syndrome.   Chronic Diabetes Complications : Retinopathy: Unknown. Last ophthalmology exam was done on Due.  Complains of blurred vision. Nephropathy: no Peripheral neuropathy: no Coronary artery disease: no Stroke: no  Relevant comorbidities and cardiovascular risk factors: Obesity: yes Body mass index is 36.82 kg/m.  Hypertension: no Hyperlipidemia :no  Current / Home Diabetic regimen includes:  Metformin  extended release 2000 mg daily.  Prior diabetic medications: Ozempic  and Mounjaro  were denied in the past.  Glycemic data:   He has not been monitoring blood sugar at home.  Hypoglycemia: Patient has on hypoglycemic episodes. Patient has hypoglycemia awareness.  Factors modifying glucose control: 1.  Diabetic diet assessment: 3 meals a day.  2.  Staying active or exercising: Walking.  3.  Medication compliance: compliant n/a of the time.  # Hypogonadism : - Patient was previously seen by Dr. Hubert Madden and was last time seen in February 2024, had initial endocrinology consultation in September 2021.  He had low testosterone  at least from 2019 , probably diagnosed with  hypogonadism in 2019.    - Patient has had complaints of fatigue, decreased motivation, decreased libido since 2016.  He was diagnosed with obstructive sleep apnea as well and was started on treatment.  In 2018 total testosterone  level was over 300.  Subsequently his testosterone  levels were low and was evaluated by endocrinologist, no history of hot flashes or sweating, no history of long-term anabolic steroid use, no history of testicular injury or known mump since childhood. Baseline evaluation including high normal LH, normal prolactin, free levels not available.  He was started on testosterone  injection, not clear why he did not try other forms of testosterone  supplement initially. Although he did feel better with starting injections he did not completely feel back to normal with his energy level or libido.  He later stopped taking testosterone  injection.   - He was started back on testosterone  injections, 50 mg every 2 weeks again in 5/21 from his PCP and was taking the injection weekly as he felt better with doing it weekly compared to every 2 weeks. Testosterone  level done 2 days after his injection in July was 394. He was first seen in endocrinology consultation in 9/21 with complaints of  fatigue, lethargy and somnolence and decreased libido. He had not taken injections for about a month and his afternoon testosterone  was 234 although free testosterone  was normal at 9.9, and 6:  Binding globulin was low normal 18.6.  He was started on 50 mg weekly of Depo testosterone  he did have some improvement in his energy level and fatigue.  Later testosterone  injection was increased to 60 mg weekly.  He stopped taking testosterone  injection few months prior to follow-up visit in January 2025. - In March 2025:  testosterone  level total level is appropriate in the context of having low sex hormone binding globulin of 15, free testosterone  is mildly low and normal gonadotropins and prolactin.  Planned to monitor  without testosterone  therapy, with focus on losing body weight.  # Patient has obstructive sleep apnea currently on BiPAP.  # Patient has obesity BMI 37 : Started on phentermine  in January 2025.  Wegovy  and Zepbound  were denied in the past.  Interval history Patient has been taking metformin  4 tablets total 2000 mg extended release daily.  Hemoglobin A1c improved to 6.1%.  Patient reports Mounjaro  and Ozempic  were denied in the past.  He has occasional GI upset with metformin .  No numbness and ting of the feet.  No vision problem.    He lost about 20 pounds of weight after being on phentermine .  He wants to continue on it.He has been feeling better this days after able to lose some weight.  No other complaints today.  He has been taking vitamin D  supplement as well.  REVIEW OF SYSTEMS As per history of present illness.   PAST MEDICAL HISTORY: Past Medical History:  Diagnosis Date   Cluster headache    Depression    ED (erectile dysfunction)    GERD (gastroesophageal reflux disease)    Hypertension    Insomnia    Low testosterone     Nasal polyp    SBO (small bowel obstruction) (HCC) 10/12/2017   Sleep apnea     PAST SURGICAL HISTORY: Past Surgical History:  Procedure Laterality Date   COLONOSCOPY WITH PROPOFOL  N/A 12/16/2021   Procedure: COLONOSCOPY WITH PROPOFOL ;  Surgeon: Urban Garden, MD;  Location: [facility];  Service: Gastroenterology;  Laterality: N/A;  2:00pm, asa 1-2   HEMORRHOID SURGERY     POLYPECTOMY  12/16/2021   Procedure: POLYPECTOMY INTESTINAL;  Surgeon: Urban Garden, MD;  Location: [facility];  Service: Gastroenterology;;    ALLERGIES: No Known Allergies  FAMILY HISTORY:  History reviewed. No pertinent family history.  SOCIAL HISTORY: Social History   Socioeconomic History   Marital status: Married    Spouse name: Not on file   Number of children: Not on file   Years of education: Not on file   Highest education  level: Not on file  Occupational History   Not on file  Tobacco Use   Smoking status: Every Day    Current packs/day: 1.50    Average packs/day: 1.5 packs/day for 38.7 years (58.0 ttl pk-yrs)    Types: Cigarettes    Start date: 10/17/1984   Smokeless tobacco: Former    Types: Snuff   Tobacco comments:    smokes 1.5 pack per day 04/28/2020  Vaping Use   Vaping status: Never Used  Substance and Sexual Activity   Alcohol use: Not Currently    Comment: occasional   Drug use: Never   Sexual activity: Not on file  Other Topics Concern   Not on file  Social History Narrative   Not on file   Social Drivers of Health   Financial Resource Strain: Not on file  Food Insecurity: Not on file  Transportation Needs: Not on file  Physical Activity: Not on file  Stress: Not on file  Social Connections: Unknown (05/15/2021)   Received from [facility]   Social Network    Social Network: Not on file    MEDICATIONS:  Current Outpatient Medications  Medication Sig Dispense Refill   Blood Glucose Monitoring Suppl DEVI 1 each by Does not apply route in the morning, at noon, and at bedtime. May substitute to any manufacturer covered by patient's insurance. 1 each 0   Dulaglutide (TRULICITY) 0.75 MG/0.5ML SOAJ Inject 0.75 mg in am weekly under skin 2 mL 0   Dulaglutide (TRULICITY) 1.5 MG/0.5ML SOAJ Inject 1.5 mg into the skin once a week. After 4 weeks of 0.75mg  dose. 6 mL 4   Glucose Blood (BLOOD GLUCOSE TEST STRIPS) STRP 1 each by In Vitro route daily. May substitute to any manufacturer covered by patient's insurance. 100 each 3   metFORMIN  (GLUCOPHAGE -XR) 500 MG 24 hr tablet Start metformin  XR 500 mg 1 tab daily for 1 week, then increase to 2 tab daily for 1 week, then increase to 3 tabs daily for 1 week and then increase to 4 tabs daily. 360 tablet 2   phentermine  15 MG capsule Take 1 capsule (15 mg total) by mouth every morning. 30 capsule 4   Semaglutide ,0.25 or 0.5MG /DOS, 2 MG/3ML SOPN  Inject 0.25 mg into the skin once a week for 4 weeks and increase to 0.5 mg weekly. 3 mL 4   Syringe/Needle, Disp, (SYRINGE 3CC/18GX1-1/2) 18G X 1-1/2 3 ML MISC Use as instructed to draw up medication 100 each 3   Syringe/Needle, Disp, (SYRINGE 3CC/22GX1) 22G X 1 3 ML MISC Use as instructed to inject medication 100 each 3   testosterone  cypionate (DEPOTESTOSTERONE CYPIONATE) 200 MG/ML injection INJECT 0.7 ML INTO THE MUSCLE EVERY 7 DAYS 4 mL 3   tirzepatide  (MOUNJARO ) 2.5 MG/0.5ML Pen Inject 2.5 mg into the skin once a week for 4 weeks and then increase to 5 mg weekly. 2 mL 4   No current facility-administered medications for this visit.    PHYSICAL EXAM: Vitals:   06/21/23 1529  BP: 118/70  Pulse: 69  Resp: 20  SpO2: 96%  Weight: 264 lb (119.7 kg)  Height: 5' 11 (1.803 m)    Body mass index is 36.82 kg/m.  Wt Readings from Last 3 Encounters:  06/21/23 264 lb (119.7 kg)  03/14/23 272 lb (123.4 kg)  02/09/23 278 lb (126.1 kg)    General: Well developed, well nourished male in no apparent distress.  HEENT: AT/[place], no external lesions.  Eyes: Conjunctiva clear and no icterus. Neck: Neck supple  Lungs: Respirations not labored Neurologic: Alert, oriented, normal speech Extremities / Skin: Dry.  Psychiatric: Does not appear depressed or anxious  Diabetic Foot Exam - Simple   No data filed    LABS Reviewed Lab Results  Component Value Date   HGBA1C 6.1 (A) 06/21/2023   HGBA1C 6.6 (H) 03/09/2023   HGBA1C 5.7 (H) 07/19/2017   No results found for: FRUCTOSAMINE No results found for: CHOL, HDL, LDLCALC, LDLDIRECT, TRIG, CHOLHDL No results found for: [facility] Lab Results  Component Value Date   CREATININE 1.00 03/03/2022   Lab Results  Component Value Date   GFR 87.23 03/03/2022    ASSESSMENT / PLAN  1. Type 2 diabetes mellitus without complication, without long-term current use of insulin (HCC)   2. Obesity (BMI 30-39.9)   3. Hypogonadism  in male   4. Vitamin D  deficiency  Diabetes Mellitus type 2, complicated by no known complications.  Newly diagnosed. - Diabetic status / severity:controlled.  Improving.  Lab Results  Component Value Date   HGBA1C 6.1 (A) 06/21/2023    - Hemoglobin A1c goal : <6.5%  Will start on Trulicity for the better diabetes control and weight loss benefit.  Ozempic  and Mounjaro  were denied in the past.  - Medications: See below.  -Start Trulicity 0.75 mg weekly for 4 weeks followed by 1.5 mg weekly.  Continue metformin  extended release 2000 mg daily.  - Home glucose testing: In the morning fasting daily.  Sent glucometer and test supplies.  - Discussed/ Gave Hypoglycemia treatment plan.  # Consult : not required at this time.   # Annual urine for microalbuminuria/ creatinine ratio, no microalbuminuria currently.  Will check today.. Last No results found for: MICRALBCREAT  # Foot check nightly.  # Annual dilated diabetic eye exams.  Advised for diabetic eye exam.  - Diet: Make healthy diabetic food choices, discussed in detail. - Life style / activity / exercise: Discussed.  2. Blood pressure  -  BP Readings from Last 1 Encounters:  06/21/23 118/70    - Control is in target.  - No change in current plans.  3. Lipid status / Hyperlipidemia - Last No results found for: [facility] -Currently not on a statin.  Will monitor in the future visits.  # Hypogonadism - Patient was diagnosed with hypogonadism in 2019, has been treated with testosterone  injections.  Complains of significant fatigue, low libido and erectile dysfunction, low motivation, depressed mood.  No detail records of initial evaluation of hypogonadism at the time of diagnosis he is available to review.  He denies having MRI pituitary in the past. -He was taking testosterone  cypionate 60 mg every week, has not taken few months. -He had been on and off of testosterone  supplements in the past. -Recent  testosterone  level total level is appropriate in the context of having low sex hormone binding globulin of 15, free testosterone  is mildly low and normal gonadotropins and prolactin.  Plan: -No plan to restart testosterone  therapy.  Would like to see with improvement of testosterone  level after weight loss.  Patient prefers this strategy to lose weight. -Will recheck testosterone  total and free level in the future in few months after losing significant amount of weight.  If he continued to have low free testosterone  levels, we need to consider MRI pituitary.   # Obesity BMI 37(baseline BMI) -Discussed in detail about lifestyle modification with diet plan, intermittent fasting and exercise. -Continue phentermine  15 mg daily.  Discussed that this is short-term use for 3 months.  He lost about 20 pounds of weight in last 3-4 months. -Continue phentermine  15 mg daily for now.  # Vitamin D  deficiency - Continue vitamin D3 over-the-counter 1000 international unit daily.  Diagnoses and all orders for this visit:  Type 2 diabetes mellitus without complication, without long-term current use of insulin (HCC) -     POCT glycosylated hemoglobin (Hb A1C) -     Dulaglutide (TRULICITY) 0.75 MG/0.5ML SOAJ; Inject 0.75 mg in am weekly under skin -     Dulaglutide (TRULICITY) 1.5 MG/0.5ML SOAJ; Inject 1.5 mg into the skin once a week. After 4 weeks of 0.75mg  dose. -     Microalbumin / creatinine urine ratio -     Basic metabolic panel with GFR  Obesity (BMI 30-39.9)  Hypogonadism in male  Vitamin D  deficiency     DISPOSITION Follow up in  clinic in 3  months suggested.  Labs today as ordered.   All questions answered and patient verbalized understanding of the plan.  [place] Pancho Rushing, MD [facility] Endocrinology [facility] [address] [place], [address] [place], [place] [address] Phone # [phone]  At least part of this note was generated using voice recognition software. Inadvertent  word errors may have occurred, which were not recognized during the proofreading process.

## 2023-06-21 NOTE — Patient Instructions (Signed)
 Same dose of metformin .  Start trulicity 0.75mg  weekly for 4 weeks and increase 1.5 mg weekly.

## 2023-06-22 LAB — BASIC METABOLIC PANEL WITH GFR
BUN: 13 mg/dL (ref 7–25)
CO2: 30 mmol/L (ref 20–32)
Calcium: 9.7 mg/dL (ref 8.6–10.3)
Chloride: 104 mmol/L (ref 98–110)
Creat: 0.89 mg/dL (ref 0.70–1.30)
Glucose, Bld: 97 mg/dL (ref 65–139)
Potassium: 4.3 mmol/L (ref 3.5–5.3)
Sodium: 140 mmol/L (ref 135–146)
eGFR: 103 mL/min/{1.73_m2} (ref >=60)

## 2023-06-22 LAB — MICROALBUMIN / CREATININE URINE RATIO
Creatinine, Urine: 125 mg/dL (ref 20–320)
Microalb, Ur: <0.2 mg/dL

## 2023-09-08 ENCOUNTER — Other Ambulatory Visit: Payer: Self-pay

## 2023-09-08 ENCOUNTER — Observation Stay (HOSPITAL_COMMUNITY)
Admission: EM | Admit: 2023-09-08 | Discharge: 2023-09-10 | Disposition: A | Discharge status: Home / Self Care | Attending: Family Medicine | Admitting: Family Medicine

## 2023-09-08 ENCOUNTER — Encounter (HOSPITAL_COMMUNITY): Payer: Self-pay

## 2023-09-08 ENCOUNTER — Emergency Department (HOSPITAL_COMMUNITY)

## 2023-09-08 DIAGNOSIS — K219 Gastro-esophageal reflux disease without esophagitis: Secondary | ICD-10-CM | POA: Diagnosis present

## 2023-09-08 DIAGNOSIS — E119 Type 2 diabetes mellitus without complications: Secondary | ICD-10-CM | POA: Diagnosis not present

## 2023-09-08 DIAGNOSIS — R109 Unspecified abdominal pain: Secondary | ICD-10-CM | POA: Diagnosis present

## 2023-09-08 DIAGNOSIS — F419 Anxiety disorder, unspecified: Secondary | ICD-10-CM | POA: Diagnosis present

## 2023-09-08 DIAGNOSIS — I10 Essential (primary) hypertension: Secondary | ICD-10-CM | POA: Diagnosis not present

## 2023-09-08 DIAGNOSIS — K56600 Partial intestinal obstruction, unspecified as to cause: Secondary | ICD-10-CM | POA: Diagnosis not present

## 2023-09-08 DIAGNOSIS — E669 Obesity, unspecified: Secondary | ICD-10-CM | POA: Diagnosis present

## 2023-09-08 DIAGNOSIS — Z23 Encounter for immunization: Secondary | ICD-10-CM | POA: Diagnosis not present

## 2023-09-08 DIAGNOSIS — Z6830 Body mass index (BMI) 30.0-30.9, adult: Secondary | ICD-10-CM | POA: Diagnosis not present

## 2023-09-08 DIAGNOSIS — K56609 Unspecified intestinal obstruction, unspecified as to partial versus complete obstruction: Principal | ICD-10-CM

## 2023-09-08 DIAGNOSIS — K567 Ileus, unspecified: Secondary | ICD-10-CM | POA: Diagnosis not present

## 2023-09-08 DIAGNOSIS — F17220 Nicotine dependence, chewing tobacco, uncomplicated: Secondary | ICD-10-CM | POA: Diagnosis not present

## 2023-09-08 LAB — COMPREHENSIVE METABOLIC PANEL WITH GFR
ALT: 32 U/L (ref 0–44)
AST: 22 U/L (ref 15–41)
Albumin: 4.5 g/dL (ref 3.5–5.0)
Alkaline Phosphatase: 92 U/L (ref 38–126)
Anion gap: 14 (ref 5–15)
BUN: 13 mg/dL (ref 6–20)
CO2: 24 mmol/L (ref 22–32)
Calcium: 9.8 mg/dL (ref 8.9–10.3)
Chloride: 101 mmol/L (ref 98–111)
Creatinine, Ser: 0.87 mg/dL (ref 0.61–1.24)
GFR, Estimated: >60 mL/min (ref >60)
Glucose, Bld: 111 mg/dL — ABNORMAL HIGH (ref 70–99)
Potassium: 4.1 mmol/L (ref 3.5–5.1)
Sodium: 139 mmol/L (ref 135–145)
Total Bilirubin: 1 mg/dL (ref 0.0–1.2)
Total Protein: 7.4 g/dL (ref 6.5–8.1)

## 2023-09-08 LAB — CBC WITH DIFFERENTIAL/PLATELET
Abs Immature Granulocytes: 0.05 K/uL (ref 0.00–0.07)
Basophils Absolute: 0 K/uL (ref 0.0–0.1)
Basophils Relative: 0 %
Eosinophils Absolute: 0.1 K/uL (ref 0.0–0.5)
Eosinophils Relative: 1 %
HCT: 46.4 % (ref 39.0–52.0)
Hemoglobin: 15.4 g/dL (ref 13.0–17.0)
Immature Granulocytes: 0 %
Lymphocytes Relative: 13 %
Lymphs Abs: 1.7 K/uL (ref 0.7–4.0)
MCH: 32.4 pg (ref 26.0–34.0)
MCHC: 33.2 g/dL (ref 30.0–36.0)
MCV: 97.7 fL (ref 80.0–100.0)
Monocytes Absolute: 0.7 K/uL (ref 0.1–1.0)
Monocytes Relative: 6 %
Neutro Abs: 10.1 K/uL — ABNORMAL HIGH (ref 1.7–7.7)
Neutrophils Relative %: 80 %
Platelets: 212 K/uL (ref 150–400)
RBC: 4.75 MIL/uL (ref 4.22–5.81)
RDW: 14.3 % (ref 11.5–15.5)
WBC: 12.7 K/uL — ABNORMAL HIGH (ref 4.0–10.5)
nRBC: 0 % (ref 0.0–0.2)

## 2023-09-08 LAB — URINALYSIS, ROUTINE W REFLEX MICROSCOPIC
Bilirubin Urine: NEGATIVE
Glucose, UA: NEGATIVE mg/dL
Hgb urine dipstick: NEGATIVE
Ketones, ur: NEGATIVE mg/dL
Leukocytes,Ua: NEGATIVE
Nitrite: NEGATIVE
Protein, ur: NEGATIVE mg/dL
Specific Gravity, Urine: >1.046 — ABNORMAL HIGH (ref 1.005–1.030)
pH: 6 (ref 5.0–8.0)

## 2023-09-08 LAB — GLUCOSE, CAPILLARY
Glucose-Capillary: 97 mg/dL (ref 70–99)
Glucose-Capillary: 91 mg/dL (ref 70–99)

## 2023-09-08 LAB — MAGNESIUM: Magnesium: 2 mg/dL (ref 1.7–2.4)

## 2023-09-08 LAB — LIPASE, BLOOD: Lipase: 35 U/L (ref 11–51)

## 2023-09-08 LAB — PROTIME-INR
INR: 0.9 (ref 0.8–1.2)
Prothrombin Time: 12.5 s (ref 11.4–15.2)

## 2023-09-08 LAB — HEMOGLOBIN A1C
Hgb A1c MFr Bld: 5.7 % — ABNORMAL HIGH (ref 4.8–5.6)
Mean Plasma Glucose: 116.89 mg/dL

## 2023-09-08 LAB — PHOSPHORUS: Phosphorus: 3.5 mg/dL (ref 2.5–4.6)

## 2023-09-08 LAB — HIV ANTIBODY (ROUTINE TESTING W REFLEX): HIV Screen 4th Generation wRfx: NONREACTIVE

## 2023-09-08 MED ORDER — ONDANSETRON HCL 4 MG/2ML IJ SOLN
4.0000 mg | Freq: Four times a day (QID) | INTRAMUSCULAR | Status: DC | PRN
Start: 2023-09-08 — End: 2023-09-10

## 2023-09-08 MED ORDER — INFLUENZA VIRUS VACC SPLIT PF (FLUZONE) 0.5 ML IM SUSY
0.5000 mL | PREFILLED_SYRINGE | INTRAMUSCULAR | Status: AC
  Administered 2023-09-09: 0.5 mL via INTRAMUSCULAR
  Filled 2023-09-08: qty 0.5

## 2023-09-08 MED ORDER — BISACODYL 5 MG PO TBEC: 5.0000 mg | DELAYED_RELEASE_TABLET | Freq: Every day | ORAL | Status: DC | PRN

## 2023-09-08 MED ORDER — SODIUM CHLORIDE 0.9% FLUSH
3.0000 mL | Freq: Two times a day (BID) | INTRAVENOUS | Status: DC
  Administered 2023-09-08 – 2023-09-10 (×4): 3 mL via INTRAVENOUS

## 2023-09-08 MED ORDER — ACETAMINOPHEN 650 MG RE SUPP: 650.0000 mg | Freq: Four times a day (QID) | RECTAL | Status: DC | PRN

## 2023-09-08 MED ORDER — HYDROMORPHONE HCL 1 MG/ML IJ SOLN
1.0000 mg | INTRAMUSCULAR | Status: DC | PRN
  Administered 2023-09-08 (×2): 1 mg via INTRAVENOUS
  Filled 2023-09-08 (×2): qty 1

## 2023-09-08 MED ORDER — SENNOSIDES-DOCUSATE SODIUM 8.6-50 MG PO TABS: 1.0000 | ORAL_TABLET | Freq: Every day | ORAL | Status: DC

## 2023-09-08 MED ORDER — HYDROMORPHONE HCL 1 MG/ML IJ SOLN: 0.5000 mg | INTRAMUSCULAR | Status: DC | PRN

## 2023-09-08 MED ORDER — LORAZEPAM 2 MG/ML IJ SOLN: 0.5000 mg | INTRAMUSCULAR | Status: DC | PRN

## 2023-09-08 MED ORDER — FENTANYL CITRATE (PF) 100 MCG/2ML IJ SOLN
50.0000 ug | Freq: Once | INTRAMUSCULAR | Status: AC
  Administered 2023-09-08: 50 ug via INTRAVENOUS
  Filled 2023-09-08: qty 2

## 2023-09-08 MED ORDER — IOHEXOL 300 MG/ML  SOLN
100.0000 mL | Freq: Once | INTRAMUSCULAR | Status: AC | PRN
Start: 2023-09-08 — End: 2023-09-08
  Administered 2023-09-08: 100 mL via INTRAVENOUS

## 2023-09-08 MED ORDER — ONDANSETRON HCL 4 MG PO TABS
4.0000 mg | ORAL_TABLET | Freq: Four times a day (QID) | ORAL | Status: DC | PRN
Start: 2023-09-08 — End: 2023-09-10

## 2023-09-08 MED ORDER — ONDANSETRON HCL 4 MG/2ML IJ SOLN
4.0000 mg | Freq: Once | INTRAMUSCULAR | Status: AC
  Administered 2023-09-08: 4 mg via INTRAVENOUS
  Filled 2023-09-08: qty 2

## 2023-09-08 MED ORDER — IPRATROPIUM BROMIDE 0.02 % IN SOLN: 0.5000 mg | Freq: Four times a day (QID) | RESPIRATORY_TRACT | Status: DC | PRN

## 2023-09-08 MED ORDER — FLEET ENEMA RE ENEM: 1.0000 | ENEMA | Freq: Once | RECTAL | Status: DC | PRN

## 2023-09-08 MED ORDER — INSULIN ASPART 100 UNIT/ML IJ SOLN: 0.0000 [IU] | INTRAMUSCULAR | Status: DC

## 2023-09-08 MED ORDER — ZOLPIDEM TARTRATE 5 MG PO TABS
5.0000 mg | ORAL_TABLET | Freq: Every evening | ORAL | Status: DC | PRN
  Administered 2023-09-09: 5 mg via ORAL
  Filled 2023-09-08: qty 1

## 2023-09-08 MED ORDER — LEVALBUTEROL HCL 0.63 MG/3ML IN NEBU: 0.6300 mg | INHALATION_SOLUTION | Freq: Four times a day (QID) | RESPIRATORY_TRACT | Status: DC | PRN

## 2023-09-08 MED ORDER — NICOTINE 21 MG/24HR TD PT24
21.0000 mg | MEDICATED_PATCH | Freq: Every day | TRANSDERMAL | Status: DC
  Administered 2023-09-08 – 2023-09-10 (×3): 21 mg via TRANSDERMAL
  Filled 2023-09-08 (×3): qty 1

## 2023-09-08 MED ORDER — SODIUM CHLORIDE 0.9 % IV SOLN: INTRAVENOUS | Status: DC

## 2023-09-08 MED ORDER — PANTOPRAZOLE SODIUM 40 MG IV SOLR
40.0000 mg | INTRAVENOUS | Status: DC
  Administered 2023-09-08 – 2023-09-09 (×2): 40 mg via INTRAVENOUS
  Filled 2023-09-08 (×2): qty 10

## 2023-09-08 MED ORDER — SENNOSIDES 8.8 MG/5ML PO SYRP
10.0000 mL | ORAL_SOLUTION | Freq: Every day | ORAL | Status: DC
  Administered 2023-09-08: 10 mL via ORAL
  Filled 2023-09-08 (×2): qty 10

## 2023-09-08 MED ORDER — OXYCODONE HCL 5 MG PO TABS: 5.0000 mg | ORAL_TABLET | ORAL | Status: DC | PRN

## 2023-09-08 MED ORDER — ACETAMINOPHEN 325 MG PO TABS: 650.0000 mg | ORAL_TABLET | Freq: Four times a day (QID) | ORAL | Status: DC | PRN

## 2023-09-08 MED ORDER — HYDRALAZINE HCL 20 MG/ML IJ SOLN: 10.0000 mg | INTRAMUSCULAR | Status: DC | PRN

## 2023-09-08 MED ORDER — SENNOSIDES-DOCUSATE SODIUM 8.6-50 MG PO TABS: 1.0000 | ORAL_TABLET | Freq: Every evening | ORAL | Status: DC | PRN

## 2023-09-08 MED ORDER — ENOXAPARIN SODIUM 40 MG/0.4ML IJ SOSY
40.0000 mg | PREFILLED_SYRINGE | INTRAMUSCULAR | Status: DC
  Administered 2023-09-08 – 2023-09-09 (×2): 40 mg via SUBCUTANEOUS
  Filled 2023-09-08 (×2): qty 0.4

## 2023-09-08 NOTE — ED Provider Notes (Signed)
 [place] EMERGENCY DEPARTMENT AT [facility] Provider Note   CSN: [id number] Arrival date & time: 09/08/23  9084     Patient presents with: Abdominal Pain   Devin Ray is a 53 y.o. male.   Patient with a history of bowel obstruction presents with nausea, vomiting, diffuse abdominal pain.,  Symptoms began a few days ago, but over the past 12 hours patient has had persistent nausea, anorexia, multiple episodes of vomiting, and notes that his abdomen is tender, distended.        Prior to Admission medications   Medication Sig Start Date End Date Taking? Authorizing Provider  Dulaglutide  (TRULICITY ) 0.75 MG/0.5ML SOAJ Inject 0.75 mg in am weekly under skin 06/21/23  Yes Thapa, [place], MD  Blood Glucose Monitoring Suppl DEVI 1 each by Does not apply route in the morning, at noon, and at bedtime. May substitute to any manufacturer covered by patient's insurance. 03/14/23   Thapa, [place], MD  Syringe/Needle, Disp, (SYRINGE 3CC/18GX1-1/2) 18G X 1-1/2 3 ML MISC Use as instructed to draw up medication 03/05/22   [place], MD  Syringe/Needle, Disp, (SYRINGE 3CC/22GX1) 22G X 1 3 ML MISC Use as instructed to inject medication 03/05/22   [place], MD    Allergies: Patient has no known allergies.    Review of Systems  Updated Vital Signs BP (!) 131/91 (BP Location: Left Arm)   Pulse 80   Temp 97.9 F (36.6 C) (Oral)   Resp 19   Ht 1.803 m (5' 11)   Wt 119.7 kg   SpO2 96%   BMI 36.81 kg/m   Physical Exam Vitals and nursing note reviewed.  Constitutional:      General: He is not in acute distress.    Appearance: He is well-developed.  HENT:     Head: Normocephalic and atraumatic.  Eyes:     Conjunctiva/sclera: Conjunctivae normal.  Cardiovascular:     Rate and Rhythm: Normal rate and regular rhythm.  Pulmonary:     Effort: Pulmonary effort is normal. No respiratory distress.     Breath sounds: No stridor.  Abdominal:     General: There is no distension.      Tenderness: There is generalized abdominal tenderness.  Skin:    General: Skin is warm and dry.  Neurological:     Mental Status: He is alert and oriented to person, place, and time.     (all labs ordered are listed, but only abnormal results are displayed) Labs Reviewed  COMPREHENSIVE METABOLIC PANEL WITH GFR - Abnormal; Notable for the following components:      Result Value   Glucose, Bld 111 (*)    All other components within normal limits  CBC WITH DIFFERENTIAL/PLATELET - Abnormal; Notable for the following components:   WBC 12.7 (*)    Neutro Abs 10.1 (*)    All other components within normal limits  EXPECTORATED SPUTUM ASSESSMENT W GRAM STAIN, RFLX TO RESP C  LIPASE, BLOOD  MAGNESIUM  PHOSPHORUS  PROTIME-INR  URINALYSIS, ROUTINE W REFLEX MICROSCOPIC  HIV ANTIBODY (ROUTINE TESTING W REFLEX)  HEMOGLOBIN A1C    EKG: None  Radiology: CT ABDOMEN PELVIS W CONTRAST Result Date: 09/08/2023 CLINICAL DATA:  Generalized abdominal pain, diarrhea and vomiting as well as some constipation. History of small-bowel obstruction. Clinical concern for small bowel obstruction. EXAM: CT ABDOMEN AND PELVIS WITH CONTRAST TECHNIQUE: Multidetector CT imaging of the abdomen and pelvis was performed using the standard protocol following bolus administration of intravenous contrast. RADIATION DOSE  REDUCTION: This exam was performed according to the departmental dose-optimization program which includes automated exposure control, adjustment of the mA and/or kV according to patient size and/or use of iterative reconstruction technique. CONTRAST:  OMNIPAQUE  IOHEXOL  300 MG/ML  SOLN COMPARISON:  Abdomen and pelvis CT report dated 09/09/2017. FINDINGS: Lower chest: Normal-sized heart. Mild bibasilar linear atelectasis or scarring. Hepatobiliary: Multiple small liver cysts. These do not need imaging follow-up. Normal-appearing gallbladder. Pancreas: Unremarkable. No pancreatic ductal dilatation or  surrounding inflammatory changes. Spleen: Normal in size without focal abnormality. Adrenals/Urinary Tract: Normal-appearing adrenal glands. 9 mm exophytic simple appearing right renal cyst measuring 10 Hounsfield units in density. This does not need imaging follow-up. Unremarkable left kidney, ureters and urinary bladder. Stomach/Bowel: Multiple mildly dilated loops of jejunum with normal caliber loops of ileum with no defined transition point seen. Large number of sigmoid and descending colon diverticula without evidence of diverticulitis. Normal-appearing appendix and stomach. Vascular/Lymphatic: Atheromatous arterial calcifications without aneurysm. No enlarged lymph nodes. Reproductive: Minimally enlarged prostate gland. Other: Tiny umbilical hernia containing fat. Musculoskeletal: Minimal lumbar and lower thoracic spine degenerative changes. IMPRESSION: 1. Multiple mildly dilated loops of jejunum with normal caliber loops of ileum with no defined transition point seen. This could be due to a mild partial small bowel obstruction or ileus. 2. Large number of sigmoid and descending colon diverticula without evidence of diverticulitis. Electronically Signed   By: Elspeth Bathe M.D.   On: 09/08/2023 11:38     Procedures   Medications Ordered in the ED  0.9 %  sodium chloride  infusion ( Intravenous New Bag/Given 09/08/23 1046)  sodium chloride  flush (NS) 0.9 % injection 3 mL (has no administration in time range)  sodium chloride  flush (NS) 0.9 % injection 3 mL (has no administration in time range)  acetaminophen  (TYLENOL ) tablet 650 mg (has no administration in time range)    Or  acetaminophen  (TYLENOL ) suppository 650 mg (has no administration in time range)  oxyCODONE  (Oxy IR/ROXICODONE ) immediate release tablet 5 mg (has no administration in time range)  zolpidem  (AMBIEN ) tablet 5 mg (has no administration in time range)  bisacodyl  (DULCOLAX) EC tablet 5 mg (has no administration in time range)   sodium phosphate (FLEET) enema 1 enema (has no administration in time range)  ondansetron  (ZOFRAN ) tablet 4 mg (has no administration in time range)    Or  ondansetron  (ZOFRAN ) injection 4 mg (has no administration in time range)  ipratropium (ATROVENT ) nebulizer solution 0.5 mg (has no administration in time range)  levalbuterol  (XOPENEX ) nebulizer solution 0.63 mg (has no administration in time range)  hydrALAZINE  (APRESOLINE ) injection 10 mg (has no administration in time range)  enoxaparin  (LOVENOX ) injection 40 mg (has no administration in time range)  sennosides (SENOKOT) 8.8 MG/5ML syrup 10 mL (has no administration in time range)  insulin  aspart (novoLOG ) injection 0-6 Units ( Subcutaneous Not Given 09/08/23 1330)  HYDROmorphone  (DILAUDID ) injection 1 mg (1 mg Intravenous Given 09/08/23 1414)  pantoprazole  (PROTONIX ) injection 40 mg (has no administration in time range)  LORazepam  (ATIVAN ) injection 0.5 mg (has no administration in time range)  influenza vac split trivalent PF (FLUZONE ) injection 0.5 mL (has no administration in time range)  nicotine  (NICODERM CQ  - dosed in mg/24 hours) patch 21 mg (has no administration in time range)  fentaNYL  (SUBLIMAZE ) injection 50 mcg (50 mcg Intravenous Given 09/08/23 1046)  ondansetron  (ZOFRAN ) injection 4 mg (4 mg Intravenous Given 09/08/23 1047)  iohexol  (OMNIPAQUE ) 300 MG/ML solution 100 mL (100 mLs Intravenous Contrast Given  09/08/23 1111)                                    Medical Decision Making Adult male with prior bowel obstruction presents with abdominal pain, nausea, vomiting.  Patient is awake and alert, afebrile, diverticulitis, or other GI infection consideration, with his distention, nausea, vomiting, history, bowel obstruction concerning. Meds monitoring CT commenced.  Amount and/or Complexity of Data Reviewed Labs: ordered. Decision-making details documented in ED Course. Radiology: ordered and independent interpretation  performed. Decision-making details documented in ED Course.  Risk Prescription drug management. Decision regarding hospitalization.   Update: CT concern for bowel obstruction, and I discussed patient's case with her surgery team.  Patient will be admitted to the internal medicine team, surgical follow the consulting service. Patient has had no vomiting since arrival to ED, has received analgesics, and antiemetics.  Absent additional vomiting, NG tube may be withheld for now.   Final diagnoses:  SBO (small bowel obstruction) [facility])    ED Discharge Orders     None          Garrick Charleston, MD 09/08/23 1541

## 2023-09-08 NOTE — ED Notes (Signed)
Aware we need urine sample

## 2023-09-08 NOTE — ED Triage Notes (Signed)
 Pt arrived via POV c/o generalized abdominal pain, diarrhea, and reports emesis today as well. Pt also reports constipation as well. Pt does report Hx of SBO.

## 2023-09-08 NOTE — Assessment & Plan Note (Signed)
-   Advised to follow-up with PCP as an outpatient for weight loss programs

## 2023-09-08 NOTE — H&P (Signed)
 History and Physical   Patient: Devin Ray                            PCP: Rosamond Leta NOVAK, MD                    DOB: 02-Feb-1970            DOA: 09/08/2023 FMW:[id number]             DOS: 09/08/2023, 1:43 PM  Vyas, Leta NOVAK, MD  Patient coming from:   HOME  I have personally reviewed patient's medical records, in electronic medical records, including:  [place] [facility], and care everywhere.    Chief Complaint:   Chief Complaint  Patient presents with   Abdominal Pain    History of present illness:    Devin TYSHAWN CIULLO is a 53 year old male extensive history of DM II, HTN, anxiety/depression, GERD, previous history of small bowel obstruction, presenting for chief complaint of diffuse cramping abdominal pain, nausea vomiting.  Symptom started few days ago, progressively worsening today associate with nausea vomiting abdominal pain today.  Last bowel movement....    ED evaluation: Blood pressure (!) 132/90, pulse 76, temperature 98.2 F (36.8 C), RR 18, height 5' 11 (1.803 m), weight 119.7 kg, SpO2 97%.  Labs; WBC 12.7, glucose 111, CT abdomen/pelvis: Multiple mildly dilated loops of jejunum with normal caliber loops of ileum with no defined transition point seen. This could be due to a mild partial small bowel obstruction or ileus. 2. Large number of sigmoid and descending colon diverticula without evidence of diverticulitis.  EDP Dr. Garrick discussed the case with general surgery Dr. Kallie who will see the patient accordingly.  Requested patient to be admitted for small bowel obstruction.    Patient Denies having: Fever, Chills, Cough, SOB, Chest Pain, headache, dizziness, lightheadedness,  Dysuria, Joint pain, rash, open wounds     Review of Systems: As per HPI, otherwise 10 point review of systems were negative.   ----------------------------------------------------------------------------------------------------------------------  No Known Allergies  Home MEDs:   Prior to Admission medications   Medication Sig Start Date End Date Taking? Authorizing Provider  Dulaglutide  (TRULICITY ) 0.75 MG/0.5ML SOAJ Inject 0.75 mg in am weekly under skin 06/21/23  Yes Thapa, [place], MD  Blood Glucose Monitoring Suppl DEVI 1 each by Does not apply route in the morning, at noon, and at bedtime. May substitute to any manufacturer covered by patient's insurance. 03/14/23   Thapa, [place], MD  Syringe/Needle, Disp, (SYRINGE 3CC/18GX1-1/2) 18G X 1-1/2 3 ML MISC Use as instructed to draw up medication 03/05/22   [place], MD  Syringe/Needle, Disp, (SYRINGE 3CC/22GX1) 22G X 1 3 ML MISC Use as instructed to inject medication 03/05/22   [place], MD    PRN MEDs: acetaminophen  **OR** acetaminophen , bisacodyl , hydrALAZINE , HYDROmorphone  (DILAUDID ) injection, ipratropium, levalbuterol , ondansetron  **OR** ondansetron  (ZOFRAN ) IV, oxyCODONE , sodium phosphate, zolpidem   Past Medical History:  Diagnosis Date   Cluster headache    Depression    ED (erectile dysfunction)    GERD (gastroesophageal reflux disease)    Hypertension    Insomnia    Low testosterone     Nasal polyp    SBO (small bowel obstruction) (HCC) 10/12/2017   Sleep apnea     Past Surgical History:  Procedure Laterality Date   COLONOSCOPY WITH PROPOFOL  N/A 12/16/2021   Procedure: COLONOSCOPY WITH PROPOFOL ;  Surgeon: Eartha Angelia Sieving, MD;  Location: [facility];  Service: Gastroenterology;  Laterality: N/A;  2:00pm, asa 1-2   HEMORRHOID SURGERY     POLYPECTOMY  12/16/2021   Procedure: POLYPECTOMY INTESTINAL;  Surgeon: Eartha Angelia Sieving, MD;  Location: [facility];  Service: Gastroenterology;;     reports that he has been smoking cigarettes. He started smoking about 38 years ago. He has a 58.3 pack-year smoking history. He has quit using smokeless tobacco.  His smokeless tobacco use included snuff. He reports that he does not currently use alcohol. He reports that he does not use  drugs.   History reviewed. No pertinent family history.  Physical Exam:   Vitals:   09/08/23 0932  BP: (!) 132/90  Pulse: 76  Resp: 18  Temp: 98.2 F (36.8 C)  TempSrc: Oral  SpO2: 97%  Weight: 119.7 kg  Height: 5' 11 (1.803 m)   Constitutional: NAD, calm, comfortable Eyes: PERRL, lids and conjunctivae normal ENMT: Mucous membranes are moist. Posterior pharynx clear of any exudate or lesions.Normal dentition.  Neck: normal, supple, no masses, no thyromegaly Respiratory: clear to auscultation bilaterally, no wheezing, no crackles. Normal respiratory effort. No accessory muscle use.  Cardiovascular: Regular rate and rhythm, no murmurs / rubs / gallops. No extremity edema. 2+ pedal pulses. No carotid bruits.  Abdomen: Diffuse tenderness with deep palpation, hypoactive bowel sound, negative for fluid shifts, negative for organomegaly  Musculoskeletal: no clubbing / cyanosis. No joint deformity upper and lower extremities. Good ROM, no contractures. Normal muscle tone.  Neurologic: CN II-XII grossly intact. Sensation intact, DTR normal. Strength 5/5 in all 4.  Psychiatric: Normal judgment and insight. Alert and oriented x 3. Normal mood.  Skin: no rashes, lesions, ulcers. No induration          Labs on admission:    I have personally reviewed following labs and imaging studies  CBC: Recent Labs  Lab 09/08/23 1005  WBC 12.7*  NEUTROABS 10.1*  HGB 15.4  HCT 46.4  MCV 97.7  PLT 212   Basic Metabolic Panel: Recent Labs  Lab 09/08/23 1005  NA 139  K 4.1  CL 101  CO2 24  GLUCOSE 111*  BUN 13  CREATININE 0.87  CALCIUM 9.8   GFR: Estimated Creatinine Clearance: 130.8 mL/min (by C-G formula based on SCr of 0.87 mg/dL). Liver Function Tests: Recent Labs  Lab 09/08/23 1005  AST 22  ALT 32  ALKPHOS 92  BILITOT 1.0  PROT 7.4  ALBUMIN 4.5   Recent Labs  Lab 09/08/23 1005  LIPASE 35     Last A1C:  Lab Results  Component Value Date   HGBA1C 6.1 (A)  06/21/2023     Radiologic Exams on Admission:   CT ABDOMEN PELVIS W CONTRAST Result Date: 09/08/2023 CLINICAL DATA:  Generalized abdominal pain, diarrhea and vomiting as well as some constipation. History of small-bowel obstruction. Clinical concern for small bowel obstruction. EXAM: CT ABDOMEN AND PELVIS WITH CONTRAST TECHNIQUE: Multidetector CT imaging of the abdomen and pelvis was performed using the standard protocol following bolus administration of intravenous contrast. RADIATION DOSE REDUCTION: This exam was performed according to the departmental dose-optimization program which includes automated exposure control, adjustment of the mA and/or kV according to patient size and/or use of iterative reconstruction technique. CONTRAST:  OMNIPAQUE  IOHEXOL  300 MG/ML  SOLN COMPARISON:  Abdomen and pelvis CT report dated 09/09/2017. FINDINGS: Lower chest: Normal-sized heart. Mild bibasilar linear atelectasis or scarring. Hepatobiliary: Multiple small liver cysts. These do not need imaging follow-up. Normal-appearing gallbladder. Pancreas: Unremarkable. No pancreatic ductal  dilatation or surrounding inflammatory changes. Spleen: Normal in size without focal abnormality. Adrenals/Urinary Tract: Normal-appearing adrenal glands. 9 mm exophytic simple appearing right renal cyst measuring 10 Hounsfield units in density. This does not need imaging follow-up. Unremarkable left kidney, ureters and urinary bladder. Stomach/Bowel: Multiple mildly dilated loops of jejunum with normal caliber loops of ileum with no defined transition point seen. Large number of sigmoid and descending colon diverticula without evidence of diverticulitis. Normal-appearing appendix and stomach. Vascular/Lymphatic: Atheromatous arterial calcifications without aneurysm. No enlarged lymph nodes. Reproductive: Minimally enlarged prostate gland. Other: Tiny umbilical hernia containing fat. Musculoskeletal: Minimal lumbar and lower thoracic  spine degenerative changes. IMPRESSION: 1. Multiple mildly dilated loops of jejunum with normal caliber loops of ileum with no defined transition point seen. This could be due to a mild partial small bowel obstruction or ileus. 2. Large number of sigmoid and descending colon diverticula without evidence of diverticulitis. Electronically Signed   By: Elspeth Bathe M.D.   On: 09/08/2023 11:38    EKG:   Independently reviewed.  Orders placed or performed during the hospital encounter of 09/08/23   EKG 12-Lead   ---------------------------------------------------------------------------------------------------------------------------------------    Assessment / Plan:   Principal Problem:   SBO (small bowel obstruction) (HCC) Active Problems:   DM (diabetes mellitus), type 2 (HCC)   Anxiety   Hypertension   GERD (gastroesophageal reflux disease)   Obesity (BMI 30-39.9)   Assessment and Plan: * SBO (small bowel obstruction) (HCC) Labs; WBC 12.7, CT abdomen/pelvis: Multiple mildly dilated loops of jejunum with normal caliber loops of ileum with no defined transition point seen. This could be due to a mild partial small bowel obstruction or ileus. 2. Large number of sigmoid and descending colon diverticula without evidence of diverticulitis.  EDP Dr. Garrick discussed the case with general surgery Dr. Kallie who will see the patient accordingly.  - Keep the patient n.p.o. -Continue IV fluid hydration with normal saline -Withholding NG tube placement at this point  DM (diabetes mellitus), type 2 (HCC) - Home medication reviewed, Trulicity  q. weekly, Last A1c 6.1 -Will be n.p.o. -Checking CBG every 4 hours, with SSI coverage  Anxiety - Currently stable, as needed IV Ativan  with any exacerbation  Obesity (BMI 30-39.9) - Advised to follow-up with PCP as an outpatient for weight loss programs  GERD (gastroesophageal reflux disease) - IV Protonix   Hypertension - Currently  not on any medication at home -Monitor closely, as needed hydralazine    Consults called:  General Surgery Dr. Kallie  --------------------------------------------------------------------------------------------------------------------------- DVT prophylaxis:  enoxaparin  (LOVENOX ) injection 40 mg Start: 09/08/23 2200 SCDs Start: 09/08/23 1316   Code Status:   Code Status: Full Code   Admission status: Patient will be admitted as Observation, with a greater than 2 midnight length of stay. Level of care: Med-Surg   Family Communication:  none at bedside  (The above findings and plan of care has been discussed with patient in detail, the patient expressed understanding and agreement of above plan)  ---------------------------------------------------------------------------------------------------------------------------  Disposition Plan:  Anticipated 1-2 days Status is: Observation The patient remains OBS appropriate and will d/c before 2 midnights.  --------------------------------------------------------------------------------------------------------------------------  Time spent:  19  Min.  Was spent seeing and evaluating the patient, reviewing all medical records, drawn plan of care.  SIGNED: Adriana DELENA Grams, MD, FHM. FAAFP. [place] [facility], Pager  (Please use [email or web address] to page/ or secure chat through epic) If 7PM-7AM, please contact night-coverage [email or web address],  09/08/2023, 1:43 PM

## 2023-09-08 NOTE — Assessment & Plan Note (Signed)
-   Currently stable, as needed IV Ativan  with any exacerbation

## 2023-09-08 NOTE — Progress Notes (Signed)
   09/08/23 1417  TOC Brief Assessment  Insurance and Status Reviewed  Patient has primary care physician Yes  Home environment has been reviewed Home with Spouse  Prior level of function: Independent  Prior/Current Home Services No current home services  Social Drivers of Health Review SDOH reviewed no interventions necessary  Readmission risk has been reviewed Yes  Transition of care needs no transition of care needs at this time   Transition of Care Department [facility]) has reviewed patient and no TOC needs have been identified at this time. We will continue to monitor patient advancement through interdisciplinary progression rounds. If new patient transition needs arise, please place a TOC consult.

## 2023-09-08 NOTE — Assessment & Plan Note (Signed)
-   Currently not on any medication at home -Monitor closely, as needed hydralazine

## 2023-09-08 NOTE — Assessment & Plan Note (Signed)
-  IV Protonix

## 2023-09-08 NOTE — Assessment & Plan Note (Signed)
-   Home medication reviewed, Trulicity  q. weekly, Last A1c 6.1 -Will be n.p.o. -Checking CBG every 4 hours, with SSI coverage

## 2023-09-08 NOTE — Hospital Course (Addendum)
 Devin Ray is a 53 year old male extensive history of DM II, HTN, anxiety/depression, GERD, previous history of small bowel obstruction, presenting for chief complaint of diffuse cramping abdominal pain, nausea vomiting.  Symptom started few days ago, progressively worsening today associate with nausea vomiting abdominal pain today.  Last bowel movement....    ED evaluation: Blood pressure (!) 132/90, pulse 76, temperature 98.2 F (36.8 C), RR 18, height 5' 11 (1.803 m), weight 119.7 kg, SpO2 97%.  Labs; WBC 12.7, glucose 111, CT abdomen/pelvis: Multiple mildly dilated loops of jejunum with normal caliber loops of ileum with no defined transition point seen. This could be due to a mild partial small bowel obstruction or ileus. 2. Large number of sigmoid and descending colon diverticula without evidence of diverticulitis.  EDP Dr. Garrick discussed the case with general surgery Dr. Kallie who will see the patient accordingly.  Requested patient to be admitted for small bowel obstruction.

## 2023-09-08 NOTE — Assessment & Plan Note (Signed)
 Labs; WBC 12.7, CT abdomen/pelvis: Multiple mildly dilated loops of jejunum with normal caliber loops of ileum with no defined transition point seen. This could be due to a mild partial small bowel obstruction or ileus. 2. Large number of sigmoid and descending colon diverticula without evidence of diverticulitis.  EDP Dr. Garrick discussed the case with general surgery Dr. Kallie who will see the patient accordingly.  - Keep the patient n.p.o. -Continue IV fluid hydration with normal saline -Withholding NG tube placement at this point

## 2023-09-09 DIAGNOSIS — K56600 Partial intestinal obstruction, unspecified as to cause: Principal | ICD-10-CM

## 2023-09-09 DIAGNOSIS — K567 Ileus, unspecified: Secondary | ICD-10-CM

## 2023-09-09 LAB — BASIC METABOLIC PANEL WITH GFR
Anion gap: 8 (ref 5–15)
BUN: 11 mg/dL (ref 6–20)
CO2: 25 mmol/L (ref 22–32)
Calcium: 8.6 mg/dL — ABNORMAL LOW (ref 8.9–10.3)
Chloride: 106 mmol/L (ref 98–111)
Creatinine, Ser: 0.84 mg/dL (ref 0.61–1.24)
GFR, Estimated: >60 mL/min (ref >60)
Glucose, Bld: 90 mg/dL (ref 70–99)
Potassium: 3.7 mmol/L (ref 3.5–5.1)
Sodium: 139 mmol/L (ref 135–145)

## 2023-09-09 LAB — GLUCOSE, CAPILLARY
Glucose-Capillary: 104 mg/dL — ABNORMAL HIGH (ref 70–99)
Glucose-Capillary: 81 mg/dL (ref 70–99)
Glucose-Capillary: 82 mg/dL (ref 70–99)
Glucose-Capillary: 83 mg/dL (ref 70–99)
Glucose-Capillary: 87 mg/dL (ref 70–99)
Glucose-Capillary: 89 mg/dL (ref 70–99)
Glucose-Capillary: 89 mg/dL (ref 70–99)

## 2023-09-09 LAB — CBC
HCT: 40.5 % (ref 39.0–52.0)
Hemoglobin: 13.6 g/dL (ref 13.0–17.0)
MCH: 32.9 pg (ref 26.0–34.0)
MCHC: 33.6 g/dL (ref 30.0–36.0)
MCV: 97.8 fL (ref 80.0–100.0)
Platelets: 170 K/uL (ref 150–400)
RBC: 4.14 MIL/uL — ABNORMAL LOW (ref 4.22–5.81)
RDW: 14.4 % (ref 11.5–15.5)
WBC: 6.2 K/uL (ref 4.0–10.5)
nRBC: 0 % (ref 0.0–0.2)

## 2023-09-09 LAB — APTT: aPTT: 32 s (ref 24–36)

## 2023-09-09 LAB — PROTIME-INR
INR: 1 (ref 0.8–1.2)
Prothrombin Time: 13.3 s (ref 11.4–15.2)

## 2023-09-09 MED ORDER — SENNOSIDES 8.8 MG/5ML PO SYRP
10.0000 mL | ORAL_SOLUTION | Freq: Two times a day (BID) | ORAL | Status: DC
  Administered 2023-09-09 – 2023-09-10 (×3): 10 mL via ORAL
  Filled 2023-09-09 (×5): qty 10

## 2023-09-09 MED ORDER — SODIUM CHLORIDE 0.9 % IV SOLN: INTRAVENOUS | Status: AC

## 2023-09-09 MED ORDER — DOCUSATE SODIUM 100 MG PO CAPS
100.0000 mg | ORAL_CAPSULE | Freq: Two times a day (BID) | ORAL | Status: DC
  Administered 2023-09-09 – 2023-09-10 (×3): 100 mg via ORAL
  Filled 2023-09-09 (×3): qty 1

## 2023-09-09 MED ORDER — ORAL CARE MOUTH RINSE: 15.0000 mL | OROMUCOSAL | Status: DC | PRN

## 2023-09-09 MED ORDER — INSULIN ASPART 100 UNIT/ML IJ SOLN: 0.0000 [IU] | Freq: Three times a day (TID) | INTRAMUSCULAR | Status: DC

## 2023-09-09 NOTE — Progress Notes (Signed)
 PROGRESS NOTE    Patient: Devin Ray                            PCP: Rosamond Leta NOVAK, MD                    DOB: October 15, 1970            DOA: 09/08/2023 [id number]             DOS: 09/09/2023, 11:37 AM   LOS: 0 days   Date of Service: The patient was seen and examined on 09/09/2023  Subjective:   The patient was seen and examined this morning. Hemodynamically stable. Reporting of gas but no bowel movements Denies any nausea vomiting-no episode overnight  Brief Narrative:   Devin Ray is a 53 year old male extensive history of DM II, HTN, anxiety/depression, GERD, previous history of small bowel obstruction, presenting for chief complaint of diffuse cramping abdominal pain, nausea vomiting.  Symptom started few days ago, progressively worsening today associate with nausea vomiting abdominal pain today.  Last bowel movement....    ED evaluation: Blood pressure (!) 132/90, pulse 76, temperature 98.2 F (36.8 C), RR 18, height 5' 11 (1.803 m), weight 119.7 kg, SpO2 97%.  Labs; WBC 12.7, glucose 111, CT abdomen/pelvis: Multiple mildly dilated loops of jejunum with normal caliber loops of ileum with no defined transition point seen. This could be due to a mild partial small bowel obstruction or ileus. 2. Large number of sigmoid and descending colon diverticula without evidence of diverticulitis.  EDP Dr. Garrick discussed the case with general surgery Dr. Kallie who will see the patient accordingly.  Requested patient to be admitted for small bowel obstruction.     Assessment & Plan:   Principal Problem:   Partial small bowel obstruction (HCC) Active Problems:   DM (diabetes mellitus), type 2 (HCC)   Anxiety   Hypertension   GERD (gastroesophageal reflux disease)   Obesity (BMI 30-39.9)   Ileus, unspecified (HCC)     Assessment and Plan: * Partial small bowel obstruction (HCC) -Improved nausea vomiting, no episodes overnight -Reporting of some gas with no bowel  movements -Has been n.p.o. overnight  -IVF -Discussed with general surgery Dr. Kallie believe patient may not have told for partial obstruction at this point -Starting clear liquid diet -Stool softeners   CT abdomen/pelvis: Multiple mildly dilated loops of jejunum with normal caliber loops of ileum with no defined transition point seen. This could be due to a mild partial small bowel obstruction or ileus. 2. Large number of sigmoid and descending colon diverticula without evidence of diverticulitis.  -Withholding NG tube placement at this point  DM (diabetes mellitus), type 2 (HCC) - Home medication reviewed, Trulicity  q. weekly, Last A1c 6.1 --Will change CBG checks to q. ACHS with coverage   Anxiety - Currently stable, as needed IV Ativan  with any exacerbation  Obesity (BMI 30-39.9) - Advised to follow-up with PCP as an outpatient for weight loss programs - Recommending if tolerated continue Trulicity   GERD (gastroesophageal reflux disease) - IV Protonix   Hypertension - Currently not on any medication at home -Monitor closely, as needed hydralazine     --------------------------------------------------------------------------------------------------------------------------- Nutritional status:  The patient's BMI is: Body mass index is 34.19 kg/m. I agree with the assessment and plan as outlined  ------------------------------------------------------------------------------------------------------------------------------------------------  DVT prophylaxis:  enoxaparin  (LOVENOX ) injection 40 mg Start: 09/08/23 2200 SCDs Start: 09/08/23 1316   Code Status:  Code Status: Full Code  Family Communication: No family member present at bedside-  -Advance care planning has been discussed.   Admission status:   Status is: Observation The patient remains OBS appropriate and will d/c before 2 midnights.   Disposition: From  - home             Planning for discharge in  1-2 days   Procedures:   No admission procedures for hospital encounter.   Antimicrobials:  Anti-infectives (From admission, onward)    None        Medication:   docusate sodium   100 mg Oral BID   enoxaparin  (LOVENOX ) injection  40 mg Subcutaneous Q24H   insulin  aspart  0-6 Units Subcutaneous Q4H   nicotine   21 mg Transdermal Daily   pantoprazole  (PROTONIX ) IV  40 mg Intravenous Q24H   sennosides  10 mL Oral BID   sodium chloride  flush  3 mL Intravenous Q12H   sodium chloride  flush  3 mL Intravenous Q12H    acetaminophen  **OR** acetaminophen , bisacodyl , hydrALAZINE , HYDROmorphone  (DILAUDID ) injection, ipratropium, levalbuterol , LORazepam , ondansetron  **OR** ondansetron  (ZOFRAN ) IV, mouth rinse, oxyCODONE , sodium phosphate, zolpidem    Objective:   Vitals:   09/09/23 0455 09/09/23 0500 09/09/23 0505 09/09/23 0510  BP:      Pulse:      Resp: 13 13 13 11   Temp:      TempSrc:      SpO2:      Weight:  111.2 kg    Height:        Intake/Output Summary (Last 24 hours) at 09/09/2023 1137 Last data filed at 09/09/2023 0900 Gross per 24 hour  Intake 1816.9 ml  Output 200 ml  Net 1616.9 ml   Filed Weights   09/08/23 0932 09/09/23 0500  Weight: 119.7 kg 111.2 kg     Physical examination:   General:  AAO x 3,  cooperative, no distress;   HEENT:  Normocephalic, PERRL, otherwise with in Normal limits   Neuro:  CNII-XII intact. , normal motor and sensation, reflexes intact   Lungs:   Clear to auscultation BL, Respirations unlabored,  No wheezes / crackles  Cardio:    S1/S2, RRR, No murmure, No Rubs or Gallops   Abdomen:  Soft, non-tender, bowel sounds present but hypoactive, no guarding or peritoneal signs.  Muscular  skeletal:  Limited exam -global generalized weaknesses - in bed, able to move all 4 extremities,   2+ pulses,  symmetric, No pitting edema  Skin:  Dry, warm to touch, negative for any Rashes,  Wounds: Please see nursing documentation        ------------------------------------------------------------------------------------------------------------------------------------------    LABs:     Latest Ref Rng & Units 09/09/2023    5:32 AM 09/08/2023   10:05 AM 03/03/2022    8:31 AM  CBC  WBC 4.0 - 10.5 K/uL 6.2  12.7  7.2   Hemoglobin 13.0 - 17.0 g/dL 86.3  84.5  85.1   Hematocrit 39.0 - 52.0 % 40.5  46.4  44.1   Platelets 150 - 400 K/uL 170  212  199.0       Latest Ref Rng & Units 09/09/2023    5:32 AM 09/08/2023   10:05 AM 06/21/2023    4:03 PM  CMP  Glucose 70 - 99 mg/dL 90  888  97   BUN 6 - 20 mg/dL 11  13  13    Creatinine 0.61 - 1.24 mg/dL 9.15  9.12  9.10   Sodium 135 - 145 mmol/L  139  139  140   Potassium 3.5 - 5.1 mmol/L 3.7  4.1  4.3   Chloride 98 - 111 mmol/L 106  101  104   CO2 22 - 32 mmol/L 25  24  30    Calcium 8.9 - 10.3 mg/dL 8.6  9.8  9.7   Total Protein 6.5 - 8.1 g/dL  7.4    Total Bilirubin 0.0 - 1.2 mg/dL  1.0    Alkaline Phos 38 - 126 U/L  92    AST 15 - 41 U/L  22    ALT 0 - 44 U/L  32         Micro Results No results found for this or any previous visit (from the past 240 hours).  Radiology Reports No results found.  SIGNED: Adriana DELENA Grams, MD, FHM. FAAFP. [facility] Time spent - 55 min.  In seeing, evaluating and examining the patient. Reviewing medical records, labs, drawn plan of care. [facility],  Pager (please use [email or web address] to page/ text) Please use Epic Secure Chat for non-urgent communication (7AM-7PM)  If 7PM-7AM, please contact night-coverage [email or web address], 09/09/2023, 11:37 AM

## 2023-09-09 NOTE — Plan of Care (Signed)

## 2023-09-09 NOTE — Consult Note (Addendum)
 I was present with the medical student for this service. I personally verified the history of present illness, performed the physical exam, and made the plan for this encounter. I have verified the medical student's documentation and made modifications where appropriately. I have personally documented in my own words a brief history, physical, and plan below.     Patient reports no prior surgery. ? SBO in 2019 but imaging with just dilated bowel. Reports hard stools daily and decreased water intake.    He is mostly worried about any concern for colon cancer due to his brother. Says he is feeling better, no nausea. Having flatus.  BP 121/77 (BP Location: Left Arm)   Pulse 68   Temp 97.7 F (36.5 C) (Oral)   Resp 11   Ht 5' 11 (1.803 m)   Wt 111.2 kg   SpO2 97%   BMI 34.19 kg/m  Soft, minimally distended, nontender, minor diastasis recti   I personally reviewed the imaging and called radiology and Dr. Jerri looked at the images. She cannot see the 2019 CT either. She agrees dilated bowel over liver and decompressed in the lower right side, no transition, favors more ileus. No obvious volvulus or internal hernia picture seen.   Right now no surgical intervention indicated. Warned patient if he continues to have issue he may need more work up with CT with oral contrast and sometimes even exploration to rule out volvulus /internal hernia of the small bowel.    Colace Clear diet Monitor Hopefully home tomorrow  Manuelita Pander, MD [facility] [address] [place], [place] [address] [phone] (office)  Reason for Consult:Small Bowel Obstruction Referring Physician: Lamar Salen  Devin Ray is an 53 y.o. male.  HPI: Devin MAXINE HUYNH is a 53 y/o male with a PMH of DM2, HTN, anxiety/depression, GERD, previous hx of SBO in 2019 presenting for diffuse abdominal pain, nausea and vomiting. Patient states that he has had nausea, bloating, and mild constipation  for the past 2 months but states he thought it was due to his Trulicity . He says 2 weeks ago he was at the beach and had an episode where he felt like he really needed to go to the bathroom but wasn't able to pass much stool. His symptoms acutely worsened on Monday where he had watery black diarrhea with abdominal pain. Yesterday his pain got progressively worse with multiple bouts of nonbloody  nonbilious vomiting and a few episodes of bilious vomiting. He last had a small BM yesterday morning and he is currently able to pass gas. Patient currently not complaining of any pain but feels that his abdomen is distended.   Of note, patient's brother has a history of colon cancer. Patient's last colonoscopy was in 12/23 and pathology showed one lipoma and 2 colonic tubular adenomas. He was recommended to have a repeat colonoscopy in 3 years.   Past Medical History:  Diagnosis Date   Cluster headache    Depression    ED (erectile dysfunction)    GERD (gastroesophageal reflux disease)    Hypertension    Insomnia    Low testosterone     Nasal polyp    SBO (small bowel obstruction) (HCC) 10/12/2017   Sleep apnea     Past Surgical History:  Procedure Laterality Date   COLONOSCOPY WITH PROPOFOL  N/A 12/16/2021   Procedure: COLONOSCOPY WITH PROPOFOL ;  Surgeon: Eartha Angelia Sieving, MD;  [facility];  Service: Gastroenterology;  Laterality: N/A;  2:00pm, asa 1-2  HEMORRHOID SURGERY     POLYPECTOMY  12/16/2021   Procedure: POLYPECTOMY INTESTINAL;  Surgeon: Eartha Angelia Sieving, MD;  Location: [facility];  Service: Gastroenterology;;    History reviewed. No pertinent family history.  Social History:  reports that he has been smoking cigarettes. He started smoking about 38 years ago. He has a 58.3 pack-year smoking history. He has quit using smokeless tobacco.  His smokeless tobacco use included snuff. He reports that he does not currently use alcohol. He reports that he does not  use drugs.  Allergies: No Known Allergies  Medications: I have reviewed the patient's current medications. Current Facility-Administered Medications  Medication Dose Route Frequency Provider Last Rate Last Admin   0.9 %  sodium chloride  infusion   Intravenous Continuous Garrick Charleston, MD 125 mL/hr at 09/09/23 0436 Infusion Verify at 09/09/23 0436   acetaminophen  (TYLENOL ) tablet 650 mg  650 mg Oral Q6H PRN Willette Adriana LABOR, MD       Or   acetaminophen  (TYLENOL ) suppository 650 mg  650 mg Rectal Q6H PRN Shahmehdi, Seyed A, MD       bisacodyl  (DULCOLAX) EC tablet 5 mg  5 mg Oral Daily PRN Shahmehdi, Seyed A, MD       docusate sodium  (COLACE) capsule 100 mg  100 mg Oral BID Kallie Manuelita BROCKS, MD       enoxaparin  (LOVENOX ) injection 40 mg  40 mg Subcutaneous Q24H Shahmehdi, Seyed A, MD   40 mg at 09/08/23 2117   hydrALAZINE  (APRESOLINE ) injection 10 mg  10 mg Intravenous Q4H PRN Shahmehdi, Seyed A, MD       HYDROmorphone  (DILAUDID ) injection 1 mg  1 mg Intravenous Q2H PRN Shahmehdi, Seyed A, MD   1 mg at 09/08/23 1748   insulin  aspart (novoLOG ) injection 0-6 Units  0-6 Units Subcutaneous Q4H Shahmehdi, Seyed A, MD       ipratropium (ATROVENT ) nebulizer solution 0.5 mg  0.5 mg Nebulization Q6H PRN Shahmehdi, Seyed A, MD       levalbuterol  (XOPENEX ) nebulizer solution 0.63 mg  0.63 mg Nebulization Q6H PRN Shahmehdi, Seyed A, MD       LORazepam  (ATIVAN ) injection 0.5 mg  0.5 mg Intravenous Q4H PRN Shahmehdi, Seyed A, MD       nicotine  (NICODERM CQ  - dosed in mg/24 hours) patch 21 mg  21 mg Transdermal Daily Shahmehdi, Seyed A, MD   21 mg at 09/09/23 0915   ondansetron  (ZOFRAN ) tablet 4 mg  4 mg Oral Q6H PRN Shahmehdi, Seyed A, MD       Or   ondansetron  (ZOFRAN ) injection 4 mg  4 mg Intravenous Q6H PRN Shahmehdi, Seyed A, MD       oxyCODONE  (Oxy IR/ROXICODONE ) immediate release tablet 5 mg  5 mg Oral Q4H PRN Shahmehdi, Seyed A, MD       pantoprazole  (PROTONIX ) injection 40 mg  40 mg  Intravenous Q24H Shahmehdi, Seyed A, MD   40 mg at 09/08/23 1701   sennosides (SENOKOT) 8.8 MG/5ML syrup 10 mL  10 mL Oral BID Shahmehdi, Seyed A, MD   10 mL at 09/09/23 0912   sodium chloride  flush (NS) 0.9 % injection 3 mL  3 mL Intravenous Q12H Shahmehdi, Seyed A, MD   3 mL at 09/09/23 0915   sodium chloride  flush (NS) 0.9 % injection 3 mL  3 mL Intravenous Q12H Shahmehdi, Seyed A, MD   3 mL at 09/09/23 0915   sodium phosphate (FLEET) enema 1 enema  1 enema Rectal  Once PRN Willette Adriana LABOR, MD       zolpidem  (AMBIEN ) tablet 5 mg  5 mg Oral QHS PRN,MR X 1 Shahmehdi, Seyed A, MD        Results for orders placed or performed during the hospital encounter of 09/08/23 (from the past 48 hours)  Lipase, blood     Status: None   Collection Time: 09/08/23 10:05 AM  Result Value Ref Range   Lipase 35 11 - 51 U/L    Comment: Performed at [facility], [address] [place], [place] [address]  Comprehensive metabolic panel     Status: Abnormal   Collection Time: 09/08/23 10:05 AM  Result Value Ref Range   Sodium 139 135 - 145 mmol/L   Potassium 4.1 3.5 - 5.1 mmol/L   Chloride 101 98 - 111 mmol/L   CO2 24 22 - 32 mmol/L   Glucose, Bld 111 (H) 70 - 99 mg/dL    Comment: Glucose reference range applies only to samples taken after fasting for at least 8 hours.   BUN 13 6 - 20 mg/dL   Creatinine, Ser 9.12 0.61 - 1.24 mg/dL   Calcium 9.8 8.9 - 89.6 mg/dL   Total Protein 7.4 6.5 - 8.1 g/dL   Albumin 4.5 3.5 - 5.0 g/dL   AST 22 15 - 41 U/L   ALT 32 0 - 44 U/L   Alkaline Phosphatase 92 38 - 126 U/L   Total Bilirubin 1.0 0.0 - 1.2 mg/dL   GFR, Estimated >39 >39 mL/min    Comment: (NOTE) Calculated using the CKD-EPI Creatinine Equation (2021)    Anion gap 14 5 - 15    Comment: Performed at [facility], [address], [place], [place] [address]  CBC with Differential     Status: Abnormal   Collection Time: 09/08/23 10:05 AM  Result Value Ref Range   WBC 12.7 (H) 4.0 - 10.5 K/uL   RBC 4.75 4.22  - 5.81 MIL/uL   Hemoglobin 15.4 13.0 - 17.0 g/dL   HCT 53.5 60.9 - 47.9 %   MCV 97.7 80.0 - 100.0 fL   MCH 32.4 26.0 - 34.0 pg   MCHC 33.2 30.0 - 36.0 g/dL   RDW 85.6 88.4 - 84.4 %   Platelets 212 150 - 400 K/uL   nRBC 0.0 0.0 - 0.2 %   Neutrophils Relative % 80 %   Neutro Abs 10.1 (H) 1.7 - 7.7 K/uL   Lymphocytes Relative 13 %   Lymphs Abs 1.7 0.7 - 4.0 K/uL   Monocytes Relative 6 %   Monocytes Absolute 0.7 0.1 - 1.0 K/uL   Eosinophils Relative 1 %   Eosinophils Absolute 0.1 0.0 - 0.5 K/uL   Basophils Relative 0 %   Basophils Absolute 0.0 0.0 - 0.1 K/uL   Immature Granulocytes 0 %   Abs Immature Granulocytes 0.05 0.00 - 0.07 K/uL    Comment: Performed at [facility], [address], [place], [place] [address]  HIV Antibody (routine testing w rflx)     Status: None   Collection Time: 09/08/23  1:25 PM  Result Value Ref Range   HIV Screen 4th Generation wRfx Non Reactive Non Reactive    Comment: Performed at [facility], [address] [place], [place] [address]  Magnesium     Status: None   Collection Time: 09/08/23  1:25 PM  Result Value Ref Range   Magnesium 2.0 1.7 - 2.4 mg/dL    Comment: Performed at  [facility], [address], [place], [place] [address]  Phosphorus     Status: None   Collection Time: 09/08/23  1:25 PM  Result Value Ref Range   Phosphorus 3.5 2.5 - 4.6 mg/dL    Comment: Performed at [facility], [address], [place], [place] [address]  Protime-INR     Status: None   Collection Time: 09/08/23  1:25 PM  Result Value Ref Range   Prothrombin Time 12.5 11.4 - 15.2 seconds   INR 0.9 0.8 - 1.2    Comment: (NOTE) INR goal varies based on device and disease states. Performed at [facility], [address], [place], [place] [address]   Hemoglobin A1c     Status: Abnormal   Collection Time: 09/08/23  1:25 PM  Result Value Ref Range   Hgb A1c MFr Bld 5.7 (H) 4.8 - 5.6 %    Comment: (NOTE) Diagnosis of Diabetes The following HbA1c ranges  recommended by the American Diabetes Association (ADA) may be used as an aid in the diagnosis of diabetes mellitus.  Hemoglobin             Suggested A1C NGSP%              Diagnosis  <5.7                   Non Diabetic  5.7-6.4                Pre-Diabetic  >6.4                   Diabetic  <7.0                   Glycemic control for                       adults with diabetes.     Mean Plasma Glucose 116.89 mg/dL    Comment: Performed at [facility], [address], [place], [place] [address]  Glucose, capillary     Status: None   Collection Time: 09/08/23  4:38 PM  Result Value Ref Range   Glucose-Capillary 97 70 - 99 mg/dL    Comment: Glucose reference range applies only to samples taken after fasting for at least 8 hours.  Urinalysis, Routine w reflex microscopic -Urine, Clean Catch     Status: Abnormal   Collection Time: 09/08/23  5:30 PM  Result Value Ref Range   Color, Urine YELLOW YELLOW   APPearance CLEAR CLEAR   Specific Gravity, Urine >1.046 (H) 1.005 - 1.030   pH 6.0 5.0 - 8.0   Glucose, UA NEGATIVE NEGATIVE mg/dL   Hgb urine dipstick NEGATIVE NEGATIVE   Bilirubin Urine NEGATIVE NEGATIVE   Ketones, ur NEGATIVE NEGATIVE mg/dL   Protein, ur NEGATIVE NEGATIVE mg/dL   Nitrite NEGATIVE NEGATIVE   Leukocytes,Ua NEGATIVE NEGATIVE    Comment: Performed at [facility], [address], [place], [place] [address]  Glucose, capillary     Status: None   Collection Time: 09/08/23  7:55 PM  Result Value Ref Range   Glucose-Capillary 91 70 - 99 mg/dL    Comment: Glucose reference range applies only to samples taken after fasting for at least 8 hours.  Glucose, capillary     Status: None   Collection Time: 09/09/23 12:12 AM  Result Value Ref Range   Glucose-Capillary 83 70 - 99 mg/dL    Comment: Glucose reference range applies only to  samples taken after fasting for at least 8 hours.  Glucose, capillary     Status: None   Collection Time: 09/09/23  4:07 AM  Result  Value Ref Range   Glucose-Capillary 81 70 - 99 mg/dL    Comment: Glucose reference range applies only to samples taken after fasting for at least 8 hours.  Basic metabolic panel     Status: Abnormal   Collection Time: 09/09/23  5:32 AM  Result Value Ref Range   Sodium 139 135 - 145 mmol/L   Potassium 3.7 3.5 - 5.1 mmol/L   Chloride 106 98 - 111 mmol/L   CO2 25 22 - 32 mmol/L   Glucose, Bld 90 70 - 99 mg/dL    Comment: Glucose reference range applies only to samples taken after fasting for at least 8 hours.   BUN 11 6 - 20 mg/dL   Creatinine, Ser 9.15 0.61 - 1.24 mg/dL   Calcium 8.6 (L) 8.9 - 10.3 mg/dL   GFR, Estimated >39 >39 mL/min    Comment: (NOTE) Calculated using the CKD-EPI Creatinine Equation (2021)    Anion gap 8 5 - 15    Comment: Performed at [facility], [address], [place], [place] [address]  CBC     Status: Abnormal   Collection Time: 09/09/23  5:32 AM  Result Value Ref Range   WBC 6.2 4.0 - 10.5 K/uL   RBC 4.14 (L) 4.22 - 5.81 MIL/uL   Hemoglobin 13.6 13.0 - 17.0 g/dL   HCT 59.4 60.9 - 47.9 %   MCV 97.8 80.0 - 100.0 fL   MCH 32.9 26.0 - 34.0 pg   MCHC 33.6 30.0 - 36.0 g/dL   RDW 85.5 88.4 - 84.4 %   Platelets 170 150 - 400 K/uL   nRBC 0.0 0.0 - 0.2 %    Comment: Performed at [facility], [address] [place], [place] [address]  Protime-INR     Status: None   Collection Time: 09/09/23  5:32 AM  Result Value Ref Range   Prothrombin Time 13.3 11.4 - 15.2 seconds   INR 1.0 0.8 - 1.2    Comment: (NOTE) INR goal varies based on device and disease states. Performed at [facility], [address], [place], [place] [address]   APTT     Status: None   Collection Time: 09/09/23  5:32 AM  Result Value Ref Range   aPTT 32 24 - 36 seconds    Comment: Performed at [facility], [address], [place], [place] [address]  Glucose, capillary     Status: None   Collection Time: 09/09/23  7:36 AM  Result Value Ref Range   Glucose-Capillary 89 70 - 99 mg/dL     Comment: Glucose reference range applies only to samples taken after fasting for at least 8 hours.    CT ABDOMEN PELVIS W CONTRAST Result Date: 09/08/2023 CLINICAL DATA:  Generalized abdominal pain, diarrhea and vomiting as well as some constipation. History of small-bowel obstruction. Clinical concern for small bowel obstruction. EXAM: CT ABDOMEN AND PELVIS WITH CONTRAST TECHNIQUE: Multidetector CT imaging of the abdomen and pelvis was performed using the standard protocol following bolus administration of intravenous contrast. RADIATION DOSE REDUCTION: This exam was performed according to the departmental dose-optimization program which includes automated exposure control, adjustment of the mA and/or kV according to patient size and/or use of iterative reconstruction technique. CONTRAST:  OMNIPAQUE  IOHEXOL  300 MG/ML  SOLN COMPARISON:  Abdomen and pelvis CT report dated 09/09/2017.  FINDINGS: Lower chest: Normal-sized heart. Mild bibasilar linear atelectasis or scarring. Hepatobiliary: Multiple small liver cysts. These do not need imaging follow-up. Normal-appearing gallbladder. Pancreas: Unremarkable. No pancreatic ductal dilatation or surrounding inflammatory changes. Spleen: Normal in size without focal abnormality. Adrenals/Urinary Tract: Normal-appearing adrenal glands. 9 mm exophytic simple appearing right renal cyst measuring 10 Hounsfield units in density. This does not need imaging follow-up. Unremarkable left kidney, ureters and urinary bladder. Stomach/Bowel: Multiple mildly dilated loops of jejunum with normal caliber loops of ileum with no defined transition point seen. Large number of sigmoid and descending colon diverticula without evidence of diverticulitis. Normal-appearing appendix and stomach. Vascular/Lymphatic: Atheromatous arterial calcifications without aneurysm. No enlarged lymph nodes. Reproductive: Minimally enlarged prostate gland. Other: Tiny umbilical hernia containing fat.  Musculoskeletal: Minimal lumbar and lower thoracic spine degenerative changes. IMPRESSION: 1. Multiple mildly dilated loops of jejunum with normal caliber loops of ileum with no defined transition point seen. This could be due to a mild partial small bowel obstruction or ileus. 2. Large number of sigmoid and descending colon diverticula without evidence of diverticulitis. Electronically Signed   By: Elspeth Bathe M.D.   On: 09/08/2023 11:38    ROS:  A comprehensive review of systems was negative except for: Gastrointestinal: positive for abdominal pain, change in bowel habits, constipation, diarrhea, melena, nausea, and vomiting  Blood pressure 121/77, pulse 68, temperature 97.7 F (36.5 C), temperature source Oral, resp. rate 11, height 5' 11 (1.803 m), weight 111.2 kg, SpO2 97%. Physical Exam Constitutional:      Appearance: He is well-developed. He is obese.  HENT:     Head: Normocephalic and atraumatic.  Eyes:     Extraocular Movements: Extraocular movements intact.     Pupils: Pupils are equal, round, and reactive to light.  Cardiovascular:     Rate and Rhythm: Normal rate and regular rhythm.  Pulmonary:     Effort: Pulmonary effort is normal.     Breath sounds: Normal breath sounds.  Abdominal:     General: Bowel sounds are normal. There is distension.     Palpations: Abdomen is soft.     Tenderness: There is no abdominal tenderness. There is no guarding or rebound.     Hernia: No hernia is present.  Skin:    General: Skin is warm and dry.  Neurological:     Mental Status: He is alert.     Assessment/Plan: Partial SBO or ileus, no evidence of complete obstruction, ischemia, or perforation. Clump of small bowel loop in RLQ and RUQ. Terminal ileum narrows. Likely not a midgut volvulus. No current abdominal pain; passing flatus; had recent small BM yesterday morning. Improving clinically with bowel rest and supportive care.   Plan - Continue conservative management - NPO ?  advance diet as tolerated (on clear liquids) - IV fluids for hydration - Serial abdominal exams - Monitor for signs of clinical deterioration - Continue with non-operative approach at this time - Consider repeat oral contrast CT if symptoms persist.  - If patient continues to improve clinically then will likely discharge tomorrow.   Ommar Khawaja 09/09/2023, 7:39 AM

## 2023-09-10 DIAGNOSIS — K56600 Partial intestinal obstruction, unspecified as to cause: Secondary | ICD-10-CM | POA: Diagnosis not present

## 2023-09-10 LAB — BASIC METABOLIC PANEL WITH GFR
Anion gap: 7 (ref 5–15)
BUN: 7 mg/dL (ref 6–20)
CO2: 23 mmol/L (ref 22–32)
Calcium: 8.9 mg/dL (ref 8.9–10.3)
Chloride: 108 mmol/L (ref 98–111)
Creatinine, Ser: 0.8 mg/dL (ref 0.61–1.24)
GFR, Estimated: >60 mL/min (ref >60)
Glucose, Bld: 96 mg/dL (ref 70–99)
Potassium: 3.6 mmol/L (ref 3.5–5.1)
Sodium: 138 mmol/L (ref 135–145)

## 2023-09-10 LAB — CBC
HCT: 40.4 % (ref 39.0–52.0)
Hemoglobin: 13.3 g/dL (ref 13.0–17.0)
MCH: 31.8 pg (ref 26.0–34.0)
MCHC: 32.9 g/dL (ref 30.0–36.0)
MCV: 96.7 fL (ref 80.0–100.0)
Platelets: 176 K/uL (ref 150–400)
RBC: 4.18 MIL/uL — ABNORMAL LOW (ref 4.22–5.81)
RDW: 13.6 % (ref 11.5–15.5)
WBC: 6.2 K/uL (ref 4.0–10.5)
nRBC: 0 % (ref 0.0–0.2)

## 2023-09-10 LAB — GLUCOSE, CAPILLARY
Glucose-Capillary: 98 mg/dL (ref 70–99)
Glucose-Capillary: 98 mg/dL (ref 70–99)

## 2023-09-10 MED ORDER — EASY-LAX PLUS 8.6-50 MG PO TABS: 2.0000 | ORAL_TABLET | Freq: Every day | ORAL | 2 refills | Status: AC

## 2023-09-10 MED ORDER — NICOTINE 21 MG/24HR TD PT24: 21.0000 mg | MEDICATED_PATCH | Freq: Every day | TRANSDERMAL | 0 refills | Status: DC

## 2023-09-10 MED ORDER — MAGNESIUM CITRATE PO SOLN
1.0000 | Freq: Once | ORAL | Status: AC
  Administered 2023-09-10: 1 via ORAL
  Filled 2023-09-10: qty 296

## 2023-09-10 NOTE — Discharge Summary (Signed)
 Physician Discharge Summary   Patient: Devin Ray MRN: [id number] DOB: 06-Dec-1970  Admit date:     09/08/2023  Discharge date: 09/10/23  Discharge Physician: Adriana DELENA Grams   PCP: Rosamond Leta NOVAK, MD   Recommendations at discharge:   Follow-up with the PCP in 1-2 weeks  Discharge Diagnoses: Principal Problem:   Partial small bowel obstruction (HCC) Active Problems:   DM (diabetes mellitus), type 2 (HCC)   Anxiety   Hypertension   GERD (gastroesophageal reflux disease)   Obesity (BMI 30-39.9)   Ileus, unspecified [facility])    Hospital Course: Devin KARAS PICKERILL is a 53 year old male extensive history of DM II, HTN, anxiety/depression, GERD, previous history of small bowel obstruction, presenting for chief complaint of diffuse cramping abdominal pain, nausea vomiting.  Symptom started few days ago, progressively worsening today associate with nausea vomiting abdominal pain today.  Last bowel movement....    ED evaluation: Blood pressure (!) 132/90, pulse 76, temperature 98.2 F (36.8 C), RR 18, height 5' 11 (1.803 m), weight 119.7 kg, SpO2 97%.  Labs; WBC 12.7, glucose 111, CT abdomen/pelvis: Multiple mildly dilated loops of jejunum with normal caliber loops of ileum with no defined transition point seen. This could be due to a mild partial small bowel obstruction or ileus. 2. Large number of sigmoid and descending colon diverticula without evidence of diverticulitis.  EDP Dr. Garrick discussed the case with general surgery Dr. Kallie who will see the patient accordingly.  Requested patient to be admitted for small bowel obstruction.   * Partial small bowel obstruction (HCC) Status post evaluation by general surgery,-no significant high-grade obstruction was appreciated -Started clear liquid diet tolerated well -Patient resumed to have gas and bowel movement -Status post IV fluid hydration -Status post treatment stool softener, laxative  CT abdomen/pelvis: Multiple mildly  dilated loops of jejunum with normal caliber loops of ileum with no defined transition point seen. This could be due to a mild partial small bowel obstruction or ileus. 2. Large number of sigmoid and descending colon diverticula without evidence of diverticulitis.   Cleared to be discharged home!   DM (diabetes mellitus), type 2 (HCC) - Home medication reviewed, Trulicity  q. weekly, Last A1c 6.1 Continue metformin  Carb modified diet  Anxiety - Currently stable, as needed IV Ativan  with any exacerbation  Obesity (BMI 30-39.9) - Advised to follow-up with PCP as an outpatient for weight loss programs  GERD (gastroesophageal reflux disease) Cont PPI    Chronic tobacco abuse  Discussed with patient and advised regarding smoking cessation Prescription NicoDerm patch was provided-  hypertension - Currently not on any medication at home -Monitor closely, as needed hydralazine          Consultants: General Surgery Dr. Kallie Procedures performed: None Disposition: Home Diet recommendation:  Discharge Diet Orders (From admission, onward)     Start     Ordered   09/10/23 0000  Diet - low sodium heart healthy        09/10/23 0944           Clear liquid diet, advance as tolerated DISCHARGE MEDICATION: Allergies as of 09/10/2023   No Known Allergies      Medication List     TAKE these medications    Blood Glucose Monitoring Suppl Devi 1 each by Does not apply route in the morning, at noon, and at bedtime. May substitute to any manufacturer covered by patient's insurance.   Easy-Lax Plus 8.6-50 MG tablet Generic drug: senna-docusate Take 2 tablets by mouth at  bedtime.   nicotine  21 mg/24hr patch Commonly known as: NICODERM CQ  - dosed in mg/24 hours Place 1 patch (21 mg total) onto the skin daily. Start taking on: September 11, 2023   SYRINGE 3CC/18GX1-1/2 18G X 1-1/2 3 ML Misc Use as instructed to draw up medication   SYRINGE 3CC/22GX1 22G X 1 3 ML  Misc Use as instructed to inject medication   Trulicity  0.75 MG/0.5ML Soaj Generic drug: Dulaglutide  Inject 0.75 mg in am weekly under skin        Discharge Exam: Filed Weights   09/08/23 0932 09/09/23 0500 09/10/23 0356  Weight: 119.7 kg 111.2 kg 111.8 kg        General:  AAO x 3,  cooperative, no distress;   HEENT:  Normocephalic, PERRL, otherwise with in Normal limits   Neuro:  CNII-XII intact. , normal motor and sensation, reflexes intact   Lungs:   Clear to auscultation BL, Respirations unlabored,  No wheezes / crackles  Cardio:    S1/S2, RRR, No murmure, No Rubs or Gallops   Abdomen:  Soft, non-tender, bowel sounds active all four quadrants, no guarding or peritoneal signs.  Muscular  skeletal:  Limited exam -global generalized weaknesses - in bed, able to move all 4 extremities,   2+ pulses,  symmetric, No pitting edema  Skin:  Dry, warm to touch, negative for any Rashes,  Wounds: Please see nursing documentation          Condition at discharge: good  The results of significant diagnostics from this hospitalization (including imaging, microbiology, ancillary and laboratory) are listed below for reference.   Imaging Studies: CT ABDOMEN PELVIS W CONTRAST Result Date: 09/08/2023 CLINICAL DATA:  Generalized abdominal pain, diarrhea and vomiting as well as some constipation. History of small-bowel obstruction. Clinical concern for small bowel obstruction. EXAM: CT ABDOMEN AND PELVIS WITH CONTRAST TECHNIQUE: Multidetector CT imaging of the abdomen and pelvis was performed using the standard protocol following bolus administration of intravenous contrast. RADIATION DOSE REDUCTION: This exam was performed according to the departmental dose-optimization program which includes automated exposure control, adjustment of the mA and/or kV according to patient size and/or use of iterative reconstruction technique. CONTRAST:  OMNIPAQUE  IOHEXOL  300 MG/ML  SOLN COMPARISON:   Abdomen and pelvis CT report dated 09/09/2017. FINDINGS: Lower chest: Normal-sized heart. Mild bibasilar linear atelectasis or scarring. Hepatobiliary: Multiple small liver cysts. These do not need imaging follow-up. Normal-appearing gallbladder. Pancreas: Unremarkable. No pancreatic ductal dilatation or surrounding inflammatory changes. Spleen: Normal in size without focal abnormality. Adrenals/Urinary Tract: Normal-appearing adrenal glands. 9 mm exophytic simple appearing right renal cyst measuring 10 Hounsfield units in density. This does not need imaging follow-up. Unremarkable left kidney, ureters and urinary bladder. Stomach/Bowel: Multiple mildly dilated loops of jejunum with normal caliber loops of ileum with no defined transition point seen. Large number of sigmoid and descending colon diverticula without evidence of diverticulitis. Normal-appearing appendix and stomach. Vascular/Lymphatic: Atheromatous arterial calcifications without aneurysm. No enlarged lymph nodes. Reproductive: Minimally enlarged prostate gland. Other: Tiny umbilical hernia containing fat. Musculoskeletal: Minimal lumbar and lower thoracic spine degenerative changes. IMPRESSION: 1. Multiple mildly dilated loops of jejunum with normal caliber loops of ileum with no defined transition point seen. This could be due to a mild partial small bowel obstruction or ileus. 2. Large number of sigmoid and descending colon diverticula without evidence of diverticulitis. Electronically Signed   By: Elspeth Bathe M.D.   On: 09/08/2023 11:38    Microbiology: No results found for this or  any previous visit.  Labs: CBC: Recent Labs  Lab 09/08/23 1005 09/09/23 0532 09/10/23 0444  WBC 12.7* 6.2 6.2  NEUTROABS 10.1*  --   --   HGB 15.4 13.6 13.3  HCT 46.4 40.5 40.4  MCV 97.7 97.8 96.7  PLT 212 170 176   Basic Metabolic Panel: Recent Labs  Lab 09/08/23 1005 09/08/23 1325 09/09/23 0532 09/10/23 0444  NA 139  --  139 138  K 4.1  --   3.7 3.6  CL 101  --  106 108  CO2 24  --  25 23  GLUCOSE 111*  --  90 96  BUN 13  --  11 7  CREATININE 0.87  --  0.84 0.80  CALCIUM 9.8  --  8.6* 8.9  MG  --  2.0  --   --   PHOS  --  3.5  --   --    Liver Function Tests: Recent Labs  Lab 09/08/23 1005  AST 22  ALT 32  ALKPHOS 92  BILITOT 1.0  PROT 7.4  ALBUMIN 4.5   CBG: Recent Labs  Lab 09/09/23 1626 09/09/23 1925 09/09/23 2317 09/10/23 0359 09/10/23 0717  GLUCAP 89 104* 87 98 98    Discharge time spent: greater than 30 minutes.  Signed: Adriana DELENA Grams, MD [facility] 09/10/2023

## 2023-09-10 NOTE — Plan of Care (Signed)

## 2023-09-27 ENCOUNTER — Encounter: Payer: Self-pay | Admitting: Endocrinology

## 2023-09-27 ENCOUNTER — Ambulatory Visit: Admitting: Endocrinology

## 2023-09-27 VITALS — BP 108/80 | HR 67 | Resp 20 | Ht 71.0 in | Wt 248.6 lb

## 2023-09-27 DIAGNOSIS — E119 Type 2 diabetes mellitus without complications: Secondary | ICD-10-CM

## 2023-09-27 DIAGNOSIS — E291 Testicular hypofunction: Secondary | ICD-10-CM

## 2023-09-27 DIAGNOSIS — E669 Obesity, unspecified: Secondary | ICD-10-CM | POA: Diagnosis not present

## 2023-09-27 DIAGNOSIS — E559 Vitamin D deficiency, unspecified: Secondary | ICD-10-CM | POA: Diagnosis not present

## 2023-09-27 MED ORDER — PHENTERMINE HCL 15 MG PO CAPS: 15.0000 mg | ORAL_CAPSULE | ORAL | 2 refills | Status: DC

## 2023-09-27 MED ORDER — TRULICITY 3 MG/0.5ML ~~LOC~~ SOAJ: 3.0000 mg | SUBCUTANEOUS | 11 refills | Status: DC

## 2023-09-27 NOTE — Progress Notes (Unsigned)
 Outpatient Endocrinology Note [place] Cash Duce, MD   Patient's Name: Devin Ray    DOB: April 26, 1970    MRN: [id number]                                                    REASON OF VISIT: Follow up for type 2 diabetes mellitus / hypogonadism  PCP: Rosamond Leta NOVAK, MD  HISTORY OF PRESENT ILLNESS:   Devin Ray is a 53 y.o. old male with past medical history listed below, is here for follow up for type 2 diabetes mellitus and hypogonadism /obesity.  Pertinent Diabetes History: Patient was being followed up in this clinic for hypogonadism, with a visit in January 2025 evaluated for obesity, fatigue and prediabetes, he has newly diagnosed type 2 diabetes mellitus with hemoglobin A1c of 6.6% in March 2025.  No personal history of pancreatitis and / or family history of medullary thyroid  carcinoma or MEN 2B syndrome.   Chronic Diabetes Complications : Retinopathy: Unknown. Last ophthalmology exam was done on Due.  Complains of blurred vision. Nephropathy: no Peripheral neuropathy: no Coronary artery disease: no Stroke: no  Relevant comorbidities and cardiovascular risk factors: Obesity: yes Body mass index is 34.67 kg/m.  Hypertension: no Hyperlipidemia :no  Current / Home Diabetic regimen includes:  Metformin  extended release 2000 mg daily. Trulicity  1.5 mg weekly.  Prior diabetic medications: Ozempic  and Mounjaro  were denied in the past.  Glycemic data:   He has not been monitoring blood sugar at home.  Hypoglycemia: Patient has on hypoglycemic episodes. Patient has hypoglycemia awareness.  Factors modifying glucose control: 1.  Diabetic diet assessment: 3 meals a day.  2.  Staying active or exercising: Walking.  3.  Medication compliance: compliant n/a of the time.  # Hypogonadism : - Patient was previously seen by Dr. Von and was last time seen in February 2024, had initial endocrinology consultation in September 2021.  He had low testosterone  at least from 2019 ,  probably diagnosed with hypogonadism in 2019.    - Patient has had complaints of fatigue, decreased motivation, decreased libido since 2016.  He was diagnosed with obstructive sleep apnea as well and was started on treatment.  In 2018 total testosterone  level was over 300.  Subsequently his testosterone  levels were low and was evaluated by endocrinologist, no history of hot flashes or sweating, no history of long-term anabolic steroid use, no history of testicular injury or known mump since childhood. Baseline evaluation including high normal LH, normal prolactin, free levels not available.  He was started on testosterone  injection, not clear why he did not try other forms of testosterone  supplement initially. Although he did feel better with starting injections he did not completely feel back to normal with his energy level or libido.  He later stopped taking testosterone  injection.   - He was started back on testosterone  injections, 50 mg every 2 weeks again in 5/21 from his PCP and was taking the injection weekly as he felt better with doing it weekly compared to every 2 weeks. Testosterone  level done 2 days after his injection in July was 394. He was first seen in endocrinology consultation in 9/21 with complaints of  fatigue, lethargy and somnolence and decreased libido. He had not taken injections for about a month and his afternoon testosterone  was 234 although free testosterone  was normal  at 9.9, and 6: Binding globulin was low normal 18.6.  He was started on 50 mg weekly of Depo testosterone  he did have some improvement in his energy level and fatigue.  Later testosterone  injection was increased to 60 mg weekly.  He stopped taking testosterone  injection few months prior to follow-up visit in January 2025. - In March 2025:  testosterone  level total level is appropriate in the context of having low sex hormone binding globulin of 15, free testosterone  is mildly low and normal gonadotropins and prolactin.   Planned to monitor without testosterone  therapy, with focus on losing body weight.  # Patient has obstructive sleep apnea currently on BiPAP.  # Patient has obesity BMI 37 : Started on phentermine  in January 2025.  Wegovy  and Zepbound  were denied in the past.  Interval history Patient has lost about 20 pounds of weight in last 3 months since last visit.  He has been overall feeling better.  Hemoglobin A1c 5.7%.  He has been taking Trulicity  and tolerating well.  He had taken phentermine  in the past however not taking currently.  He is having difficulty losing weight lately.  No other complaints today.  He has been taking vitamin D  supplement as well.  REVIEW OF SYSTEMS As per history of present illness.   PAST MEDICAL HISTORY: Past Medical History:  Diagnosis Date   Cluster headache    Depression    ED (erectile dysfunction)    GERD (gastroesophageal reflux disease)    Hypertension    Insomnia    Low testosterone     Nasal polyp    SBO (small bowel obstruction) (HCC) 10/12/2017   Sleep apnea     PAST SURGICAL HISTORY: Past Surgical History:  Procedure Laterality Date   COLONOSCOPY WITH PROPOFOL  N/A 12/16/2021   Procedure: COLONOSCOPY WITH PROPOFOL ;  Surgeon: Eartha Angelia Sieving, MD;  Location: [facility];  Service: Gastroenterology;  Laterality: N/A;  2:00pm, asa 1-2   HEMORRHOID SURGERY     POLYPECTOMY  12/16/2021   Procedure: POLYPECTOMY INTESTINAL;  Surgeon: Eartha Angelia Sieving, MD;  Location: [facility];  Service: Gastroenterology;;    ALLERGIES: No Known Allergies  FAMILY HISTORY:  History reviewed. No pertinent family history.  SOCIAL HISTORY: Social History   Socioeconomic History   Marital status: Married    Spouse name: Not on file   Number of children: Not on file   Years of education: Not on file   Highest education level: Not on file  Occupational History   Not on file  Tobacco Use   Smoking status: Every Day    Current packs/day:  1.50    Average packs/day: 1.5 packs/day for 38.9 years (58.4 ttl pk-yrs)    Types: Cigarettes    Start date: 10/17/1984   Smokeless tobacco: Former    Types: Snuff   Tobacco comments:    smokes 1.5 pack per day 04/28/2020  Vaping Use   Vaping status: Never Used  Substance and Sexual Activity   Alcohol use: Not Currently    Comment: occasional   Drug use: Never   Sexual activity: Not on file  Other Topics Concern   Not on file  Social History Narrative   Not on file   Social Drivers of Health   Financial Resource Strain: Not on file  Food Insecurity: No Food Insecurity (09/08/2023)   Hunger Vital Sign    Worried About Running Out of Food in the Last Year: Never true    Ran Out of Food in the  Last Year: Never true  Transportation Needs: No Transportation Needs (09/08/2023)   PRAPARE - Administrator, Civil Service (Medical): No    Lack of Transportation (Non-Medical): No  Physical Activity: Not on file  Stress: Not on file  Social Connections: Unknown (05/15/2021)   Received from [facility]   Social Network    Social Network: Not on file    MEDICATIONS:  Current Outpatient Medications  Medication Sig Dispense Refill   Blood Glucose Monitoring Suppl DEVI 1 each by Does not apply route in the morning, at noon, and at bedtime. May substitute to any manufacturer covered by patient's insurance. 1 each 0   Dulaglutide  (TRULICITY ) 3 MG/0.5ML SOAJ Inject 3 mg into the skin once a week. 2 mL 11   nicotine  (NICODERM CQ  - DOSED IN MG/24 HOURS) 21 mg/24hr patch Place 1 patch (21 mg total) onto the skin daily. 28 patch 0   phentermine  15 MG capsule Take 1 capsule (15 mg total) by mouth every morning. 30 capsule 2   senna-docusate (EASY-LAX PLUS) 8.6-50 MG tablet Take 2 tablets by mouth at bedtime. 60 tablet 2   Syringe/Needle, Disp, (SYRINGE 3CC/18GX1-1/2) 18G X 1-1/2 3 ML MISC Use as instructed to draw up medication 100 each 3   Syringe/Needle, Disp, (SYRINGE 3CC/22GX1)  22G X 1 3 ML MISC Use as instructed to inject medication 100 each 3   No current facility-administered medications for this visit.    PHYSICAL EXAM: Vitals:   09/27/23 1547  BP: 108/80  Pulse: 67  Resp: 20  SpO2: 96%  Weight: 248 lb 9.6 oz (112.8 kg)  Height: 5' 11 (1.803 m)     Body mass index is 34.67 kg/m.  Wt Readings from Last 3 Encounters:  09/27/23 248 lb 9.6 oz (112.8 kg)  09/10/23 246 lb 7.6 oz (111.8 kg)  06/21/23 264 lb (119.7 kg)    General: Well developed, well nourished male in no apparent distress.  HEENT: AT/[place], no external lesions.  Eyes: Conjunctiva clear and no icterus. Neck: Neck supple  Lungs: Respirations not labored Neurologic: Alert, oriented, normal speech Extremities / Skin: Dry.  Psychiatric: Does not appear depressed or anxious  Diabetic Foot Exam - Simple   No data filed    LABS Reviewed Lab Results  Component Value Date   HGBA1C 5.7 (H) 09/08/2023   HGBA1C 6.1 (A) 06/21/2023   HGBA1C 6.6 (H) 03/09/2023   No results found for: FRUCTOSAMINE No results found for: CHOL, HDL, LDLCALC, LDLDIRECT, TRIG, CHOLHDL Lab Results  Component Value Date   MICRALBCREAT NOTE 06/21/2023   Lab Results  Component Value Date   CREATININE 0.80 09/10/2023   Lab Results  Component Value Date   GFR 87.23 03/03/2022    ASSESSMENT / PLAN  1. Type 2 diabetes mellitus without complication, without long-term current use of insulin  (HCC)   2. Obesity (BMI 30-39.9)   3. Hypogonadism in male   4. Vitamin D  deficiency     Diabetes Mellitus type 2, complicated by no known complications.  Newly diagnosed. - Diabetic status / severity:controlled.  Improving.  Lab Results  Component Value Date   HGBA1C 5.7 (H) 09/08/2023    - Hemoglobin A1c goal : <6.5%  - Medications: See below.  -Increase Trulicity  from 1.5 to 3 mg weekly.  Asked to call our clinic after a month if he feels comfortable we can increase Trulicity  to 4.5 mg  weekly.  Continue metformin  extended release 2000 mg daily.  - Home glucose  testing: In the morning fasting daily.  Sent glucometer and test supplies.  - Discussed/ Gave Hypoglycemia treatment plan.  # Consult : not required at this time.   # Annual urine for microalbuminuria/ creatinine ratio, no microalbuminuria currently. Last  Lab Results  Component Value Date   MICRALBCREAT NOTE 06/21/2023    # Foot check nightly.  # Annual dilated diabetic eye exams.  Advised for diabetic eye exam.  - Diet: Make healthy diabetic food choices, discussed in detail. - Life style / activity / exercise: Discussed.  2. Blood pressure  -  BP Readings from Last 1 Encounters:  09/27/23 108/80    - Control is in target.  - No change in current plans.  3. Lipid status / Hyperlipidemia - Last No results found for: [facility] -Currently not on a statin.  Will monitor in the future visits.  # Hypogonadism - Patient was diagnosed with hypogonadism in 2019, has been treated with testosterone  injections.  Complains of significant fatigue, low libido and erectile dysfunction, low motivation, depressed mood.  No detail records of initial evaluation of hypogonadism at the time of diagnosis he is available to review.  He denies having MRI pituitary in the past. -He was taking testosterone  cypionate 60 mg every week, has not taken few months. -He had been on and off of testosterone  supplements in the past. -Recent testosterone  level total level is appropriate in the context of having low sex hormone binding globulin of 15, free testosterone  is mildly low and normal gonadotropins and prolactin.  Plan: -No plan to restart testosterone  therapy.  Would like to see with improvement of testosterone  level after weight loss.  Patient prefers this strategy to lose weight. -Will recheck testosterone  total and free level in the future in few months after losing significant amount of weight.  If he continued to have  low free testosterone  levels, we need to consider MRI pituitary.   # Obesity BMI 37(baseline BMI) -Discussed in detail about lifestyle modification with diet plan, intermittent fasting and exercise. -He had taken phentermine  in the past.  Restart phentermine  15 mg daily for 3 months. -Continue phentermine  15 mg daily.  Discussed that this is short-term use for 3 months.  He  # Vitamin D  deficiency - Continue vitamin D3 over-the-counter 1000 international unit daily.  Diagnoses and all orders for this visit:  Type 2 diabetes mellitus without complication, without long-term current use of insulin  (HCC) -     Dulaglutide  (TRULICITY ) 3 MG/0.5ML SOAJ; Inject 3 mg into the skin once a week.  Obesity (BMI 30-39.9) -     phentermine  15 MG capsule; Take 1 capsule (15 mg total) by mouth every morning.  Hypogonadism in male  Vitamin D  deficiency      DISPOSITION Follow up in clinic in 3  months suggested.    All questions answered and patient verbalized understanding of the plan.  [place]raq Reiley Bertagnolli, MD [facility] Endocrinology [facility] [address] [place], [address] [place], [place] [address] Phone # [phone]  At least part of this note was generated using voice recognition software. Inadvertent word errors may have occurred, which were not recognized during the proofreading process.

## 2023-09-28 ENCOUNTER — Encounter: Payer: Self-pay | Admitting: Endocrinology

## 2023-10-06 ENCOUNTER — Other Ambulatory Visit: Payer: Self-pay

## 2023-10-06 DIAGNOSIS — E119 Type 2 diabetes mellitus without complications: Secondary | ICD-10-CM

## 2023-10-06 MED ORDER — TRULICITY 3 MG/0.5ML ~~LOC~~ SOAJ: 3.0000 mg | SUBCUTANEOUS | 11 refills | Status: AC

## 2023-10-19 ENCOUNTER — Encounter (INDEPENDENT_AMBULATORY_CARE_PROVIDER_SITE_OTHER): Payer: Self-pay | Admitting: Gastroenterology

## 2023-11-08 ENCOUNTER — Emergency Department (HOSPITAL_COMMUNITY)
Admission: EM | Admit: 2023-11-08 | Discharge: 2023-11-09 | Disposition: A | Attending: Emergency Medicine | Admitting: Emergency Medicine

## 2023-11-08 ENCOUNTER — Encounter (HOSPITAL_COMMUNITY): Payer: Self-pay | Admitting: *Deleted

## 2023-11-08 ENCOUNTER — Emergency Department (HOSPITAL_COMMUNITY)

## 2023-11-08 ENCOUNTER — Other Ambulatory Visit: Payer: Self-pay

## 2023-11-08 DIAGNOSIS — E119 Type 2 diabetes mellitus without complications: Secondary | ICD-10-CM | POA: Diagnosis not present

## 2023-11-08 DIAGNOSIS — D72829 Elevated white blood cell count, unspecified: Secondary | ICD-10-CM | POA: Diagnosis not present

## 2023-11-08 DIAGNOSIS — I10 Essential (primary) hypertension: Secondary | ICD-10-CM | POA: Diagnosis not present

## 2023-11-08 DIAGNOSIS — K56600 Partial intestinal obstruction, unspecified as to cause: Secondary | ICD-10-CM | POA: Insufficient documentation

## 2023-11-08 DIAGNOSIS — R1084 Generalized abdominal pain: Secondary | ICD-10-CM | POA: Diagnosis present

## 2023-11-08 LAB — COMPREHENSIVE METABOLIC PANEL WITH GFR
ALT: 20 U/L (ref 0–44)
AST: 18 U/L (ref 15–41)
Albumin: 5.1 g/dL — ABNORMAL HIGH (ref 3.5–5.0)
Alkaline Phosphatase: 104 U/L (ref 38–126)
Anion gap: 12 (ref 5–15)
BUN: 12 mg/dL (ref 6–20)
CO2: 28 mmol/L (ref 22–32)
Calcium: 10.5 mg/dL — ABNORMAL HIGH (ref 8.9–10.3)
Chloride: 102 mmol/L (ref 98–111)
Creatinine, Ser: 0.97 mg/dL (ref 0.61–1.24)
GFR, Estimated: >60 mL/min (ref >60)
Glucose, Bld: 127 mg/dL — ABNORMAL HIGH (ref 70–99)
Potassium: 4.1 mmol/L (ref 3.5–5.1)
Sodium: 142 mmol/L (ref 135–145)
Total Bilirubin: 0.6 mg/dL (ref 0.0–1.2)
Total Protein: 8 g/dL (ref 6.5–8.1)

## 2023-11-08 LAB — CBC
HCT: 49.5 % (ref 39.0–52.0)
Hemoglobin: 17 g/dL (ref 13.0–17.0)
MCH: 32.1 pg (ref 26.0–34.0)
MCHC: 34.3 g/dL (ref 30.0–36.0)
MCV: 93.6 fL (ref 80.0–100.0)
Platelets: 253 K/uL (ref 150–400)
RBC: 5.29 MIL/uL (ref 4.22–5.81)
RDW: 13.2 % (ref 11.5–15.5)
WBC: 14.4 K/uL — ABNORMAL HIGH (ref 4.0–10.5)
nRBC: 0 % (ref 0.0–0.2)

## 2023-11-08 LAB — URINALYSIS, ROUTINE W REFLEX MICROSCOPIC
Glucose, UA: NEGATIVE mg/dL
Hgb urine dipstick: NEGATIVE
Ketones, ur: 5 mg/dL — AB
Leukocytes,Ua: NEGATIVE
Nitrite: NEGATIVE
Protein, ur: 100 mg/dL — AB
Specific Gravity, Urine: 1.034 — ABNORMAL HIGH (ref 1.005–1.030)
pH: 5 (ref 5.0–8.0)

## 2023-11-08 LAB — LIPASE, BLOOD: Lipase: 28 U/L (ref 11–51)

## 2023-11-08 MED ORDER — LACTATED RINGERS IV BOLUS: 500.0000 mL | Freq: Once | INTRAVENOUS | Status: DC

## 2023-11-08 MED ORDER — ONDANSETRON HCL 4 MG/2ML IJ SOLN
4.0000 mg | Freq: Once | INTRAMUSCULAR | Status: AC
  Administered 2023-11-08: 4 mg via INTRAVENOUS
  Filled 2023-11-08: qty 2

## 2023-11-08 MED ORDER — HYDROMORPHONE HCL 1 MG/ML IJ SOLN
0.5000 mg | Freq: Once | INTRAMUSCULAR | Status: AC
  Administered 2023-11-08: 0.5 mg via INTRAVENOUS
  Filled 2023-11-08: qty 0.5

## 2023-11-08 MED ORDER — IOHEXOL 300 MG/ML  SOLN
100.0000 mL | Freq: Once | INTRAMUSCULAR | Status: AC | PRN
  Administered 2023-11-08: 100 mL via INTRAVENOUS

## 2023-11-08 MED ORDER — LACTATED RINGERS IV BOLUS
1000.0000 mL | Freq: Once | INTRAVENOUS | Status: AC
  Administered 2023-11-08: 1000 mL via INTRAVENOUS

## 2023-11-08 NOTE — ED Provider Notes (Incomplete)
 [place] EMERGENCY DEPARTMENT AT [facility] Provider Note   CSN: [id number] Arrival date & time: 11/08/23  1720     Patient presents with: Abdominal Pain   Devin Ray is a 53 y.o. male.  {Add pertinent medical, surgical, social history, OB history to HPI:32947}  Abdominal Pain Associated symptoms: diarrhea, nausea and vomiting   Patient presents for abdominal pain and distention.  Medical history includes HTN, SBO, anxiety, depression, GERD, DM.  He was admitted to the hospital 2 months ago for small bowel obstruction.  This was managed conservatively.  Since last week, he has had recurrence of a generalized abdominal pain.  He has had associated diarrhea and bloating sensation.  Today, he had worsening of pain and distention.  He has continued to have diarrhea.  He developed nausea and vomiting this afternoon.  Patient describes current symptoms as reminiscent of prior SBO.     Prior to Admission medications   Medication Sig Start Date End Date Taking? Authorizing Provider  Blood Glucose Monitoring Suppl DEVI 1 each by Does not apply route in the morning, at noon, and at bedtime. May substitute to any manufacturer covered by patient's insurance. 03/14/23   Thapa, Sudan, MD  Dulaglutide  (TRULICITY ) 3 MG/0.5ML SOAJ Inject 3 mg into the skin once a week. 10/06/23   Thapa, Sudan, MD  nicotine  (NICODERM CQ  - DOSED IN MG/24 HOURS) 21 mg/24hr patch Place 1 patch (21 mg total) onto the skin daily. 09/11/23   Willette Adriana LABOR, MD  phentermine  15 MG capsule Take 1 capsule (15 mg total) by mouth every morning. 09/27/23   Thapa, Sudan, MD  senna-docusate (EASY-LAX PLUS) 8.6-50 MG tablet Take 2 tablets by mouth at bedtime. 09/10/23 12/09/23  Willette Adriana LABOR, MD  Syringe/Needle, Disp, (SYRINGE 3CC/18GX1-1/2) 18G X 1-1/2 3 ML MISC Use as instructed to draw up medication 03/05/22   [place], MD  Syringe/Needle, Disp, (SYRINGE 3CC/22GX1) 22G X 1 3 ML MISC Use as instructed to inject  medication 03/05/22   [place], MD    Allergies: Patient has no known allergies.    Review of Systems  Gastrointestinal:  Positive for abdominal distention, abdominal pain, diarrhea, nausea and vomiting.  All other systems reviewed and are negative.   Updated Vital Signs BP (!) 138/91   Pulse (!) 103   Temp 97.6 F (36.4 C) (Temporal)   Resp 18   Ht 5' 11 (1.803 m)   Wt 107.5 kg   SpO2 100%   BMI 33.05 kg/m   Physical Exam Vitals and nursing note reviewed.  Constitutional:      General: He is not in acute distress.    Appearance: He is well-developed. He is not ill-appearing, toxic-appearing or diaphoretic.  HENT:     Head: Normocephalic and atraumatic.     Mouth/Throat:     Mouth: Mucous membranes are moist.  Eyes:     Conjunctiva/sclera: Conjunctivae normal.  Cardiovascular:     Rate and Rhythm: Normal rate and regular rhythm.  Pulmonary:     Effort: Pulmonary effort is normal. No respiratory distress.  Abdominal:     General: There is distension.     Palpations: Abdomen is soft.     Tenderness: There is generalized abdominal tenderness.  Musculoskeletal:        General: No swelling.     Cervical back: Neck supple.  Skin:    General: Skin is warm and dry.  Neurological:     General: No focal deficit present.  Mental Status: He is alert and oriented to person, place, and time.  Psychiatric:        Mood and Affect: Mood normal.        Behavior: Behavior normal.     (all labs ordered are listed, but only abnormal results are displayed) Labs Reviewed  COMPREHENSIVE METABOLIC PANEL WITH GFR - Abnormal; Notable for the following components:      Result Value   Glucose, Bld 127 (*)    Calcium 10.5 (*)    Albumin 5.1 (*)    All other components within normal limits  CBC - Abnormal; Notable for the following components:   WBC 14.4 (*)    All other components within normal limits  URINALYSIS, ROUTINE W REFLEX MICROSCOPIC - Abnormal; Notable for the  following components:   Color, Urine AMBER (*)    APPearance CLOUDY (*)    Specific Gravity, Urine 1.034 (*)    Bilirubin Urine SMALL (*)    Ketones, ur 5 (*)    Protein, ur 100 (*)    Bacteria, UA RARE (*)    All other components within normal limits  LIPASE, BLOOD    EKG: None  Radiology: No results found.  {Document cardiac monitor, telemetry assessment procedure when appropriate:32947} Procedures   Medications Ordered in the ED - No data to display    {Click here for ABCD2, HEART and other calculators REFRESH Note before signing:1}                              Medical Decision Making Amount and/or Complexity of Data Reviewed Labs: ordered. Radiology: ordered.   This patient presents to the ED for concern of ***, this involves an extensive number of treatment options, and is a complaint that carries with it a high risk of complications and morbidity.  The differential diagnosis includes ***   Co morbidities / Chronic conditions that complicate the patient evaluation  ***   Additional history obtained:  Additional history obtained from EMR External records from outside source obtained and reviewed including ***   Lab Tests:  I Ordered, and personally interpreted labs.  The pertinent results include:  ***   Imaging Studies ordered:  I ordered imaging studies including ***  I independently visualized and interpreted imaging which showed *** I agree with the radiologist interpretation   Cardiac Monitoring: / EKG:  The patient was maintained on a cardiac monitor.  I personally viewed and interpreted the cardiac monitored which showed an underlying rhythm of: ***   Problem List / ED Course / Critical interventions / Medication management  Patient presenting for abdominal pain, distention, nausea, vomiting.  Symptoms are reminiscent of prior SBO.  On arrival in the ED, patient is mildly tachycardic.  He is overall well-appearing on exam.  He endorses  ongoing pain and nausea.  Dilaudid  and Zofran  were ordered for symptomatic relief.  IV fluids ordered for hydration.  Lab work is notable for leukocytosis.  CT imaging was ordered.*** I ordered medication including ***   Reevaluation of the patient after these medicines showed that the patient *** I have reviewed the patients home medicines and have made adjustments as needed   Consultations Obtained:  I requested consultation with the ***,  and discussed lab and imaging findings as well as pertinent plan - they recommend: ***   Social Determinants of Health:  ***   Test / Admission - Considered:  ***   {Document critical  care time when appropriate  Document review of labs and clinical decision tools ie CHADS2VASC2, etc  Document your independent review of radiology images and any outside records  Document your discussion with family members, caretakers and with consultants  Document social determinants of health affecting pt's care  Document your decision making why or why not admission, treatments were needed:32947:::1}   Final diagnoses:  None    ED Discharge Orders     None

## 2023-11-08 NOTE — ED Triage Notes (Signed)
 Pt with emesis today, hx of bowel obstruction about two months ago. Last week had diarrhea and has continued. Bloating since yesterday.

## 2023-11-09 ENCOUNTER — Telehealth: Payer: Self-pay | Admitting: Gastroenterology

## 2023-11-09 ENCOUNTER — Emergency Department (HOSPITAL_COMMUNITY)

## 2023-11-09 MED ORDER — ONDANSETRON 4 MG PO TBDP: 4.0000 mg | ORAL_TABLET | Freq: Three times a day (TID) | ORAL | 0 refills | Status: DC | PRN

## 2023-11-09 NOTE — Discharge Instructions (Addendum)
 You were evaluated in the Emergency Department and after careful evaluation, we did not find any emergent condition requiring admission or further testing in the hospital.  Your exam/testing today is overall reassuring.  Symptoms may have been due to a transient small bowel obstruction.  Reassuring that you are now having frequent bowel movements and you are feeling better.  Restrict your diet to fluids and broth for the next 24 to 48 hours as we discussed.  Can use Zofran  as needed for nausea.  Follow-up closely with your GI specialist.  Please return to the Emergency Department if you experience any worsening of your condition.   Thank you for allowing us  to be a part of your care.

## 2023-11-09 NOTE — Telephone Encounter (Signed)
 Devin Angelia Sieving, MD  Burnis Devere BIRCH Hi, Please schedule an appointment in next available with him. Thanks       Previous Messages    ----- Message ----- From: Theadore Ozell HERO, MD Sent: 11/09/2023   3:25 AM EST To: [name], MD Subject: ED patient                                    Dr. Eartha,  Patient of yours here in the emergency department with abdominal bloating, found to have evidence of small bowel obstruction.  Fortunately here in the emergency department he began having bowel movements and his symptoms resolved.  Repeat CT scan showed significant improvement.  He felt comfortable being discharged home with dietary restriction and close outpatient follow-up.  Sounds like he had a similar presentation a few months ago.  He says he is due for a colonoscopy as well.  I have advised him to follow-up in your office.  Thanks for the help.  Ozell HERO. Theadore, MD [facility] Emergency Medicine [facility] [email or web address]

## 2023-11-09 NOTE — Telephone Encounter (Signed)
 Patient is aware of OV for 11/20 at 1030 with Dr Eartha

## 2023-11-09 NOTE — ED Provider Notes (Signed)
  Provider Note MRN:  [id number]  Arrival date & time: 11/09/23    ED Course and Medical Decision Making  Assumed care of patient at sign-out or upon transfer.  Concern for SBO on initial CT however large bowel movement here in the emergency department.  Hospitalist consulted, plan is for repeat CT imaging and reassessment of patient to determine need for admission.  3 AM update: On reassessment patient is feeling a lot better, resting comfortably, no longer feels pain or bloating.  He has had 2 more large bowel movements since the repeat CT scan.  Repeat CT scan is showing general improvement, still possibly a partial small bowel obstruction.  We discussed management options, inpatient versus discharge with close monitoring of symptoms and close follow-up.  Given his clinical improvement I feel he is safe for discharge with return precautions.  He agrees to restrict his diet and follow-up with his primary care doctor and GI doctor.  He will return with any worsening of symptoms.  Procedures  Final Clinical Impressions(s) / ED Diagnoses     ICD-10-CM   1. Partial small bowel obstruction (HCC)  K56.600       ED Discharge Orders          Ordered    ondansetron  (ZOFRAN -ODT) 4 MG disintegrating tablet  Every 8 hours PRN        11/09/23 0321              Discharge Instructions      You were evaluated in the Emergency Department and after careful evaluation, we did not find any emergent condition requiring admission or further testing in the hospital.  Your exam/testing today is overall reassuring.  Symptoms may have been due to a transient small bowel obstruction.  Reassuring that you are now having frequent bowel movements and you are feeling better.  Restrict your diet to fluids and broth for the next 24 to 48 hours as we discussed.  Can use Zofran  as needed for nausea.  Follow-up closely with your GI specialist.  Please return to the Emergency Department if you experience any  worsening of your condition.   Thank you for allowing us  to be a part of your care.      Devin HERO. Theadore, MD [facility] Emergency Medicine [facility] [email or web address]    Ray Devin HERO, MD 11/09/23 [phone]

## 2023-11-09 NOTE — ED Notes (Signed)
 Pt states he feels much better after large bowel movement.

## 2023-11-24 ENCOUNTER — Telehealth (INDEPENDENT_AMBULATORY_CARE_PROVIDER_SITE_OTHER): Payer: Self-pay

## 2023-11-24 ENCOUNTER — Encounter (INDEPENDENT_AMBULATORY_CARE_PROVIDER_SITE_OTHER): Payer: Self-pay | Admitting: Gastroenterology

## 2023-11-24 ENCOUNTER — Other Ambulatory Visit (INDEPENDENT_AMBULATORY_CARE_PROVIDER_SITE_OTHER): Payer: Self-pay | Admitting: Gastroenterology

## 2023-11-24 ENCOUNTER — Ambulatory Visit (INDEPENDENT_AMBULATORY_CARE_PROVIDER_SITE_OTHER): Admitting: Gastroenterology

## 2023-11-24 VITALS — BP 128/79 | HR 82 | Temp 98.0°F | Ht 71.0 in | Wt 242.6 lb

## 2023-11-24 DIAGNOSIS — K56600 Partial intestinal obstruction, unspecified as to cause: Secondary | ICD-10-CM

## 2023-11-24 NOTE — Telephone Encounter (Signed)
 ATC patient, no answer and vm box full. Called patients wife and spoke with her, gave her MR Entero appt for 11/29/2023 at 4:15 pm (NPO 6 hours, arrive at 2:45 pm). Patients wife verbalized understanding.

## 2023-11-24 NOTE — Progress Notes (Signed)
 SABRA

## 2023-11-24 NOTE — Progress Notes (Signed)
 Toribio Fortune, M.D. Gastroenterology & Hepatology [facility] Gastroenterology [address] [place], [place] [address]  Primary Care Physician: Rosamond Leta NOVAK, MD [address] [place] [address]  I will communicate my assessment and recommendations to the referring MD via EMR.  Problems: Recurrent small bowel obstructions Family history of colon cancer.  History of Present Illness: Devin Ray is a 53 y.o. male with past medical history of depression, GERD, hypertension, small bowel obstruction, sleep apnea, who presents for evaluation of recurrent small bowel obstructions.  The patient had a hospitalization in September 2025.  At that time he was found to have multiple dilated loops of jejunum with no defined transition point possibly related to mild partial small bowel obstruction or possible ileus.  He was evaluated by general surgery and his diet was slowly advanced.  He was seen again on 11/08/2023 after presenting abdominal pain and abdominal distention.  Had 1 large bowel movement while in the ER.  He underwent an initial CT of the abdomen and pelvis with IV contrast which showed small bowel obstruction without transition point but possibly located in the distal small bowel, along with sigmoid diverticulosis.  After he had a bowel movement in the ER he had a repeat CT of the abdomen and pelvis without contrast that showed decreasing severity of small bowel dilation.  States that he had foul smelling belch a couple of days prior to being admitted to the hospital, along with abdominal distention and vomited once. He had similar symptoms at the time he went to the ER in 11/2023.  Patient reports that in 2019 he had a small bowel obstruction, for which he was admitted to [facility] and had to undergo NG tube placement.  States that after he left the hospital, he has been holding the Trulicity . He deneis having any complaints at the moment. The patient  denies having any nausea, vomiting, fever, chills, hematochezia, melena, hematemesis, abdominal distention, abdominal pain, diarrhea, jaundice, pruritus or weight loss.  Reports that he started using Trulicity  during the Summer of 2025.  Only surgery he has had was a hemorrhoidectomy.  Last EGD: never Last Colonoscopy: 12/16/2021 - Two 4 to 8 mm polyps in the transverse colon and in the ascending colon, removed with a cold snare. Resected and retrieved. - One 4 mm polyp in the descending colon, removed with a cold snare. Resected and retrieved. - Diverticulosis in the sigmoid colon and in the descending colon. - The distal rectum and anal verge are normal on retroflexion view.  Pathology showed one lipoma and 2 colonic tubular adenomas, repeat colonoscopy in 3 years   Past Medical History: Past Medical History:  Diagnosis Date   Cluster headache    Depression    ED (erectile dysfunction)    GERD (gastroesophageal reflux disease)    Hypertension    Insomnia    Low testosterone     Nasal polyp    SBO (small bowel obstruction) (HCC) 10/12/2017   Sleep apnea     Past Surgical History: Past Surgical History:  Procedure Laterality Date   COLONOSCOPY WITH PROPOFOL  N/A 12/16/2021   Procedure: COLONOSCOPY WITH PROPOFOL ;  Surgeon: Fortune Angelia Toribio, MD;  Location: [facility];  Service: Gastroenterology;  Laterality: N/A;  2:00pm, asa 1-2   HEMORRHOID SURGERY     POLYPECTOMY  12/16/2021   Procedure: POLYPECTOMY INTESTINAL;  Surgeon: Fortune Angelia Toribio, MD;  Location: [facility];  Service: Gastroenterology;;    Family History:History reviewed. No  pertinent family history.  Social History: Social History   Tobacco Use  Smoking Status Every Day   Current packs/day: 1.50   Average packs/day: 1.5 packs/day for 39.1 years (58.7 ttl pk-yrs)   Types: Cigarettes   Start date: 10/17/1984  Smokeless Tobacco Former   Types: Snuff  Tobacco Comments   smokes 1.5 pack per  day 04/28/2020   Social History   Substance and Sexual Activity  Alcohol Use Not Currently   Comment: occasional   Social History   Substance and Sexual Activity  Drug Use Never    Allergies: No Known Allergies  Medications: Current Outpatient Medications  Medication Sig Dispense Refill   Blood Glucose Monitoring Suppl DEVI 1 each by Does not apply route in the morning, at noon, and at bedtime. May substitute to any manufacturer covered by patient's insurance. 1 each 0   Dulaglutide  (TRULICITY ) 3 MG/0.5ML SOAJ Inject 3 mg into the skin once a week. 6 mL 11   metFORMIN  (GLUCOPHAGE -XR) 500 MG 24 hr tablet Take 1,000 mg by mouth 2 (two) times daily.     phentermine  15 MG capsule Take 1 capsule (15 mg total) by mouth every morning. 30 capsule 2   ondansetron  (ZOFRAN -ODT) 4 MG disintegrating tablet Take 1 tablet (4 mg total) by mouth every 8 (eight) hours as needed for nausea or vomiting. (Patient not taking: Reported on 11/24/2023) 20 tablet 0   senna-docusate (EASY-LAX PLUS) 8.6-50 MG tablet Take 2 tablets by mouth at bedtime. (Patient not taking: Reported on 11/24/2023) 60 tablet 2   No current facility-administered medications for this visit.    Review of Systems: GENERAL: negative for malaise, night sweats HEENT: No changes in hearing or vision, no nose bleeds or other nasal problems. NECK: Negative for lumps, goiter, pain and significant neck swelling RESPIRATORY: Negative for cough, wheezing CARDIOVASCULAR: Negative for chest pain, leg swelling, palpitations, orthopnea GI: SEE HPI MUSCULOSKELETAL: Negative for joint pain or swelling, back pain, and muscle pain. SKIN: Negative for lesions, rash PSYCH: Negative for sleep disturbance, mood disorder and recent psychosocial stressors. HEMATOLOGY Negative for prolonged bleeding, bruising easily, and swollen nodes. ENDOCRINE: Negative for cold or heat intolerance, polyuria, polydipsia and goiter. NEURO: negative for tremor, gait  imbalance, syncope and seizures. The remainder of the review of systems is noncontributory.   Physical Exam: BP 128/79 (BP Location: Left Arm, Patient Position: Sitting, Cuff Size: Large)   Pulse 82   Temp 98 F (36.7 C) (Temporal)   Ht 5' 11 (1.803 m)   Wt 242 lb 9.6 oz (110 kg)   BMI 33.84 kg/m  GENERAL: The patient is AO x3, in no acute distress. HEENT: Head is normocephalic and atraumatic. EOMI are intact. Mouth is well hydrated and without lesions. NECK: Supple. No masses LUNGS: Clear to auscultation. No presence of rhonchi/wheezing/rales. Adequate chest expansion HEART: RRR, normal s1 and s2. ABDOMEN: Soft, nontender, no guarding, no peritoneal signs, and nondistended. BS +. No masses. EXTREMITIES: Without any cyanosis, clubbing, rash, lesions or edema. NEUROLOGIC: AOx3, no focal motor deficit. SKIN: no jaundice, no rashes  Imaging/Labs: as above  I personally reviewed and interpreted the available labs, imaging and endoscopic files.  Impression and Plan: [place] ELIAH MARQUARD is a 53 y.o. male with past medical history of depression, GERD, hypertension, small bowel obstruction, sleep apnea, who presents for evaluation of recurrent small bowel obstructions.  Patient has presented a couple of episodes of partial small bowel obstruction.  Notably, he has not undergone any surgical intervention in his  abdomen, making the possibility of adhesions less likely.  It is not clear why he has presented this episode of small bowel obstruction.  We discussed that even though Trulicity  may lead to hypomotility of the small bowel, he was not taking this medication back in 2019 when he has his first SBO episode.  We discussed that it was unlikely he had a colonic etiology of his symptoms as his last colonoscopy did not show any masses and was relatively recent.  We will evaluate further his small bowel with an MR enterography to rule out any endoluminal abnormalities causing obstruction  episodes.  -Schedule MR enterography -Will restart Trulicity  once MR enterography results are back -Will consider surgical evaluation if presenting recurrent issues with bowel obstruction  All questions were answered.      Toribio Fortune, MD Gastroenterology and Hepatology [facility]enterology

## 2023-11-24 NOTE — Patient Instructions (Addendum)
 Schedule MR enterography Will restart Trulicity  once MR enterography results are back Will consider surgical evaluation if presenting recurrent issues with bowel obstruction

## 2023-11-24 NOTE — Telephone Encounter (Signed)
 PA on [facility] for MR Entero: This member's plan does not currently require notification or prior-authorization through the [employer] Notification or Prior-Authorization Program.

## 2023-11-29 ENCOUNTER — Ambulatory Visit (HOSPITAL_COMMUNITY)
Admission: RE | Admit: 2023-11-29 | Discharge: 2023-11-29 | Disposition: A | Discharge status: Home / Self Care | Source: Ambulatory Visit | Attending: Gastroenterology | Admitting: Gastroenterology

## 2023-11-29 DIAGNOSIS — K56600 Partial intestinal obstruction, unspecified as to cause: Secondary | ICD-10-CM

## 2023-11-29 MED ORDER — GADOBUTROL 1 MMOL/ML IV SOLN
10.0000 mL | Freq: Once | INTRAVENOUS | Status: AC | PRN
  Administered 2023-11-29: 10 mL via INTRAVENOUS

## 2023-11-30 ENCOUNTER — Ambulatory Visit (INDEPENDENT_AMBULATORY_CARE_PROVIDER_SITE_OTHER): Payer: Self-pay | Admitting: Gastroenterology

## 2024-01-23 ENCOUNTER — Encounter: Payer: Self-pay | Admitting: Endocrinology

## 2024-01-23 ENCOUNTER — Ambulatory Visit (INDEPENDENT_AMBULATORY_CARE_PROVIDER_SITE_OTHER): Admitting: Endocrinology

## 2024-01-23 ENCOUNTER — Ambulatory Visit: Payer: Self-pay | Admitting: Endocrinology

## 2024-01-23 VITALS — BP 132/78 | HR 74 | Resp 16 | Ht 71.0 in | Wt 251.4 lb

## 2024-01-23 DIAGNOSIS — E291 Testicular hypofunction: Secondary | ICD-10-CM

## 2024-01-23 DIAGNOSIS — E559 Vitamin D deficiency, unspecified: Secondary | ICD-10-CM | POA: Diagnosis not present

## 2024-01-23 DIAGNOSIS — E669 Obesity, unspecified: Secondary | ICD-10-CM | POA: Diagnosis not present

## 2024-01-23 DIAGNOSIS — Z7984 Long term (current) use of oral hypoglycemic drugs: Secondary | ICD-10-CM

## 2024-01-23 DIAGNOSIS — Z6835 Body mass index (BMI) 35.0-35.9, adult: Secondary | ICD-10-CM

## 2024-01-23 DIAGNOSIS — E119 Type 2 diabetes mellitus without complications: Secondary | ICD-10-CM | POA: Diagnosis not present

## 2024-01-23 DIAGNOSIS — Z7985 Long-term (current) use of injectable non-insulin antidiabetic drugs: Secondary | ICD-10-CM

## 2024-01-23 LAB — POCT GLYCOSYLATED HEMOGLOBIN (HGB A1C): Hemoglobin A1C: 6.2 % — AB (ref 4.0–5.6)

## 2024-01-23 MED ORDER — PHENTERMINE HCL 15 MG PO CAPS: 15.0000 mg | ORAL_CAPSULE | ORAL | 2 refills | Status: AC

## 2024-01-23 NOTE — Progress Notes (Signed)
 "  Outpatient Endocrinology Note Devin Dusing, MD   Patient's Name: Devin Ray    DOB: 07/05/1970    MRN: [id number]                                                    REASON OF VISIT: Follow up for type 2 diabetes mellitus / hypogonadism  PCP: Devin Leta NOVAK, MD  HISTORY OF PRESENT ILLNESS:   Devin Ray is a 54 y.o. old male with past medical history listed below, is here for follow up for type 2 diabetes mellitus and hypogonadism / obesity.  Interval history Hemoglobin A1C 6.2%. He gained ~ 14 lbs weight since last visit.  Current diabetes regimen as reviewed below.  No glucose data to review.  He reports during holiday season eating extra carbohydrate and high meals.  He has occasional abdominal fullness and burping.  Currently taking Trulicity  3 mg weekly.  He has complaints of low energy and low libido.  He wants to reevaluate testosterone  levels.  No other complaints today.  Current / Home Diabetic regimen includes:  Metformin  extended release 2000 mg daily. Trulicity  3 mg weekly.  Glycemic data:   He has not been monitoring blood sugar at home.  Hypoglycemia: Patient has on hypoglycemic episodes. Patient has hypoglycemia awareness.  Factors modifying glucose control: 1.  Diabetic diet assessment: 3 meals a day.  2.  Staying active or exercising: Walking.  3.  Medication compliance: compliant n/a of the time.  Pertinent Diabetes History: reviewed and updated  Patient was being followed up in this clinic for hypogonadism, with a visit in January 2025 evaluated for obesity, fatigue and prediabetes,   He was diagnosed type 2 diabetes mellitus with initial hemoglobin A1c of 6.6% in March 2025. He was prediabetic prior to diagnosis.   No personal history of pancreatitis and / or family history of medullary thyroid  carcinoma or MEN 2B syndrome.   Prior diabetic medications: Ozempic  and Mounjaro  were denied in the past.  Chronic Diabetes Complications : Retinopathy:  Unknown. Last ophthalmology exam was done on Due.  Complains of blurred vision. Nephropathy: no Peripheral neuropathy: no Coronary artery disease: no Stroke: no  Relevant comorbidities and cardiovascular risk factors: Obesity: yes Body mass index is 35.06 kg/m.  Hypertension: no Hyperlipidemia :no  # Hypogonadism : - Patient was previously seen by Dr. Von and was last time seen in February 2024, had initial endocrinology consultation in September 2021.  He had low testosterone  at least from 2019, probably diagnosed with hypogonadism in 2019.    - Patient has had complaints of fatigue, decreased motivation, decreased libido since 2016.  He was diagnosed with obstructive sleep apnea as well and was started on treatment.  In 2018 total testosterone  level was over 300.  Subsequently his testosterone  levels were low and was evaluated by endocrinologist, no history of hot flashes or sweating, no history of long-term anabolic steroid use, no history of testicular injury or known mump since childhood. Baseline evaluation including high normal LH, normal prolactin, free levels not available.  He was started on testosterone  injection, not clear why he did not try other forms of testosterone  supplement initially. Although he did feel better with starting injections he did not completely feel back to normal with his energy level or libido.  He later stopped taking testosterone  injection.   -  He was started back on testosterone  injections, 50 mg every 2 weeks again in 5/21 from his PCP and was taking the injection weekly as he felt better with doing it weekly compared to every 2 weeks. Testosterone  level done 2 days after his injection in July was 394. He was first seen in endocrinology consultation in 9/21 with complaints of  fatigue, lethargy and somnolence and decreased libido. He had not taken injections for about a month and his afternoon testosterone  was 234 although free testosterone  was normal at 9.9,  and sex hormone binding globulin was low normal 18.6.  He was started on 50 mg weekly of Depo testosterone  he did have some improvement in his energy level and fatigue.  Later testosterone  injection was increased to 60 mg weekly.  He stopped taking testosterone  injection few months prior to follow-up visit in January 2025.  - In March 2025:  testosterone  level total level was appropriate in the context of having low sex hormone binding globulin of 15, free testosterone  is mildly low and normal gonadotropins and prolactin.  Planned to monitor without testosterone  therapy, with focus on losing body weight.  # Patient has obstructive sleep apnea currently on BiPAP.  # Patient has obesity BMI 37 : Started on phentermine  in January 2025.  Wegovy  and Zepbound  were denied in the past.  REVIEW OF SYSTEMS As per history of present illness.   PAST MEDICAL HISTORY: Past Medical History:  Diagnosis Date   Cluster headache    Depression    ED (erectile dysfunction)    GERD (gastroesophageal reflux disease)    Hypertension    Insomnia    Low testosterone     Nasal polyp    SBO (small bowel obstruction) (HCC) 10/12/2017   Sleep apnea     PAST SURGICAL HISTORY: Past Surgical History:  Procedure Laterality Date   COLONOSCOPY WITH PROPOFOL  N/A 12/16/2021   Procedure: COLONOSCOPY WITH PROPOFOL ;  Surgeon: Devin Angelia Sieving, MD;  Location: [facility];  Service: Gastroenterology;  Laterality: N/A;  2:00pm, asa 1-2   HEMORRHOID SURGERY     POLYPECTOMY  12/16/2021   Procedure: POLYPECTOMY INTESTINAL;  Surgeon: Devin Angelia Sieving, MD;  Location: [facility];  Service: Gastroenterology;;    ALLERGIES: No Known Allergies  FAMILY HISTORY:  History reviewed. No pertinent family history.  SOCIAL HISTORY: Social History   Socioeconomic History   Marital status: Married    Spouse name: Not on file   Number of children: Not on file   Years of education: Not on file   Highest  education level: Not on file  Occupational History   Not on file  Tobacco Use   Smoking status: Every Day    Current packs/day: 1.50    Average packs/day: 1.5 packs/day for 39.3 years (58.9 ttl pk-yrs)    Types: Cigarettes    Start date: 10/17/1984   Smokeless tobacco: Former    Types: Snuff   Tobacco comments:    smokes 1.5 pack per day 04/28/2020  Vaping Use   Vaping status: Never Used  Substance and Sexual Activity   Alcohol use: Not Currently    Comment: occasional   Drug use: Never   Sexual activity: Not on file  Other Topics Concern   Not on file  Social History Narrative   Not on file   Social Drivers of Health   Tobacco Use: High Risk (01/23/2024)   Patient History    Smoking Tobacco Use: Every Day    Smokeless Tobacco Use: Former  Passive Exposure: Not on file  Financial Resource Strain: Not on file  Food Insecurity: No Food Insecurity (09/08/2023)   Epic    Worried About Programme Researcher, Broadcasting/film/video in the Last Year: Never true    Ran Out of Food in the Last Year: Never true  Transportation Needs: No Transportation Needs (09/08/2023)   Epic    Lack of Transportation (Medical): No    Lack of Transportation (Non-Medical): No  Physical Activity: Not on file  Stress: Not on file  Social Connections: Unknown (05/15/2021)   Received from [facility]   Social Network    Social Network: Not on file  Depression (PHQ2-9): Not on file  Alcohol Screen: Not on file  Housing: Low Risk (09/08/2023)   Epic    Unable to Pay for Housing in the Last Year: No    Number of Times Moved in the Last Year: 0    Homeless in the Last Year: No  Utilities: Not At Risk (09/08/2023)   Epic    Threatened with loss of utilities: No  Health Literacy: Not on file    MEDICATIONS:  Current Outpatient Medications  Medication Sig Dispense Refill   Blood Glucose Monitoring Suppl DEVI 1 each by Does not apply route in the morning, at noon, and at bedtime. May substitute to any manufacturer covered by  patient's insurance. 1 each 0   Dulaglutide  (TRULICITY ) 3 MG/0.5ML SOAJ Inject 3 mg into the skin once a week. 6 mL 11   metFORMIN  (GLUCOPHAGE -XR) 500 MG 24 hr tablet Take 1,000 mg by mouth 2 (two) times daily.     phentermine  15 MG capsule Take 1 capsule (15 mg total) by mouth every morning. 30 capsule 2   No current facility-administered medications for this visit.    PHYSICAL EXAM: Vitals:   01/23/24 1553  BP: 132/78  Pulse: 74  Resp: 16  SpO2: 98%  Weight: 251 lb 6.4 oz (114 kg)  Height: 5' 11 (1.803 m)      Body mass index is 35.06 kg/m.  Wt Readings from Last 3 Encounters:  01/23/24 251 lb 6.4 oz (114 kg)  11/24/23 242 lb 9.6 oz (110 kg)  11/08/23 237 lb (107.5 kg)    General: Well developed, well nourished male in no apparent distress.  HEENT: AT/[place], no external lesions.  Eyes: Conjunctiva clear and no icterus. Neck: Neck supple  Lungs: Respirations not labored Neurologic: Alert, oriented, normal speech Extremities / Skin: Dry.  Psychiatric: Does not appear depressed or anxious  Diabetic Foot Exam - Simple   No data filed    LABS Reviewed Lab Results  Component Value Date   HGBA1C 6.2 (A) 01/23/2024   HGBA1C 5.7 (H) 09/08/2023   HGBA1C 6.1 (A) 06/21/2023   No results found for: FRUCTOSAMINE No results found for: CHOL, HDL, LDLCALC, LDLDIRECT, TRIG, CHOLHDL Lab Results  Component Value Date   MICRALBCREAT NOTE 06/21/2023   Lab Results  Component Value Date   CREATININE 0.97 11/08/2023   Lab Results  Component Value Date   GFR 87.23 03/03/2022    ASSESSMENT / PLAN  1. Type 2 diabetes mellitus without complication, without long-term current use of insulin  (HCC)   2. Obesity (BMI 30-39.9)   3. Testicular hypofunction   4. Hypogonadism in male   5. Vitamin D  deficiency     Diabetes Mellitus type 2, complicated by no known complications.  Newly diagnosed. - Diabetic status / severity:controlled.    Lab Results  Component  Value Date  HGBA1C 6.2 (A) 01/23/2024    - Hemoglobin A1c goal : <6.5%  - Medications: See below.  - Continue Trulicity  3 mg weekly.  Will keep the current dose, will consider to increase in the future, he still has mild GI issues.  Continue metformin  extended release 2000 mg daily.  - Home glucose testing: In the morning fasting daily.  Prescribed glucometer and test supplies in last visit.  - Discussed/ Gave Hypoglycemia treatment plan.  # Consult : not required at this time.   # Annual urine for microalbuminuria/ creatinine ratio, no microalbuminuria currently. Last  Lab Results  Component Value Date   MICRALBCREAT NOTE 06/21/2023    # Foot check nightly.  # Annual dilated diabetic eye exams.  Advised for diabetic eye exam.  He will find local eye clinic.  - Diet: Make healthy diabetic food choices, discussed in detail. - Life style / activity / exercise: Discussed.  2. Blood pressure  -  BP Readings from Last 1 Encounters:  01/23/24 132/78    - Control is in target.  - No change in current plans.  3. Lipid status / Hyperlipidemia - Last No results found for: [facility] -Currently not on a statin.  Will check lipid panel.  # Hypogonadism - Patient was diagnosed with hypogonadism in 2019, has been treated with testosterone  injections.  Complains of significant fatigue, low libido and erectile dysfunction, low motivation, depressed mood.  No detail records of initial evaluation of hypogonadism at the time of diagnosis he is available to review.  He denies having MRI pituitary in the past. -He had been on and off of testosterone  supplements in the past.  Last time he was taking testosterone  cypionate 60 mg every week.  When not taking for several months, testosterone  level total level is appropriate in the context of having low sex hormone binding globulin of 15, free testosterone  is mildly low and normal gonadotropins and prolactin. - Plan was made to monitor  testosterone  level without testosterone  supplement with a focus to lose body weight, March 2025.  Plan: -He reports increased complaints of low libido and fatigue.  Will check total, free testosterone  along with sex hormone binding globulin and gonadotropins. -If he continued to have low free testosterone  levels, we need to consider MRI pituitary. - He will complete lab in the morning fasting in few days.   # Obesity BMI 37(baseline BMI) -Discussed in detail about lifestyle modification with diet plan, intermittent fasting and exercise. -He had taken phentermine  in the past.   - Discussed that this is short-term use for 3 months.  He prefers to continue and restart phentermine  for long-term. - Restart phentermine  15 mg daily.  # Vitamin D  deficiency - Continue vitamin D3 over-the-counter 1000 international unit daily.  Diagnoses and all orders for this visit:  Type 2 diabetes mellitus without complication, without long-term current use of insulin  (HCC) -     POCT glycosylated hemoglobin (Hb A1C) -     Lipid panel  Obesity (BMI 30-39.9) -     phentermine  15 MG capsule; Take 1 capsule (15 mg total) by mouth every morning.  Testicular hypofunction -     Testosterone  , Free and Total -     Follicle stimulating hormone -     Luteinizing hormone -     Sex hormone binding globulin  Hypogonadism in male -     Testosterone  , Free and Total -     Follicle stimulating hormone -     Luteinizing hormone -  Sex hormone binding globulin  Vitamin D  deficiency     DISPOSITION Follow up in clinic in 3  months suggested.  Labs in the morning fasting as ordered.   All questions answered and patient verbalized understanding of the plan.  Jerremy Maione, MD [facility] Endocrinology [facility] [address] [place], [address] [place], [place] [address] Phone # [phone]  At least part of this note was generated using voice recognition software. Inadvertent word errors may have  occurred, which were not recognized during the proofreading process. "

## 2024-01-24 ENCOUNTER — Telehealth: Payer: Self-pay | Admitting: Endocrinology

## 2024-01-24 NOTE — Telephone Encounter (Signed)
 Error

## 2024-02-07 ENCOUNTER — Other Ambulatory Visit

## 2024-05-21 ENCOUNTER — Ambulatory Visit: Admitting: Endocrinology
